# Patient Record
Sex: Female | Born: 1935 | Race: White | Hispanic: No | Smoking: Never smoker
Health system: Southern US, Community
[De-identification: ages and names within clinical notes are randomized; demographics above are authoritative.]

## PROBLEM LIST (undated history)

## (undated) DIAGNOSIS — M1990 Unspecified osteoarthritis, unspecified site: Secondary | ICD-10-CM

## (undated) DIAGNOSIS — I2510 Atherosclerotic heart disease of native coronary artery without angina pectoris: Secondary | ICD-10-CM

## (undated) DIAGNOSIS — E039 Hypothyroidism, unspecified: Secondary | ICD-10-CM

## (undated) DIAGNOSIS — I4891 Unspecified atrial fibrillation: Secondary | ICD-10-CM

## (undated) DIAGNOSIS — R519 Headache, unspecified: Secondary | ICD-10-CM

## (undated) DIAGNOSIS — R51 Headache: Secondary | ICD-10-CM

## (undated) DIAGNOSIS — Z8669 Personal history of other diseases of the nervous system and sense organs: Secondary | ICD-10-CM

## (undated) DIAGNOSIS — I10 Essential (primary) hypertension: Secondary | ICD-10-CM

## (undated) DIAGNOSIS — K219 Gastro-esophageal reflux disease without esophagitis: Secondary | ICD-10-CM

## (undated) DIAGNOSIS — R296 Repeated falls: Secondary | ICD-10-CM

## (undated) DIAGNOSIS — I219 Acute myocardial infarction, unspecified: Secondary | ICD-10-CM

## (undated) DIAGNOSIS — J189 Pneumonia, unspecified organism: Secondary | ICD-10-CM

## (undated) DIAGNOSIS — R569 Unspecified convulsions: Secondary | ICD-10-CM

## (undated) DIAGNOSIS — Z8489 Family history of other specified conditions: Secondary | ICD-10-CM

## (undated) DIAGNOSIS — I639 Cerebral infarction, unspecified: Secondary | ICD-10-CM

## (undated) HISTORY — PX: ABDOMINAL HYSTERECTOMY: SHX81

## (undated) HISTORY — PX: CATARACT EXTRACTION W/ INTRAOCULAR LENS  IMPLANT, BILATERAL: SHX1307

## (undated) HISTORY — PX: CORONARY ANGIOPLASTY: SHX604

## (undated) HISTORY — PX: OOPHORECTOMY: SHX86

---

## 1998-07-17 ENCOUNTER — Other Ambulatory Visit: Admission: RE | Admit: 1998-07-17 | Discharge: 1998-07-17 | Payer: Self-pay | Admitting: *Deleted

## 1999-08-14 ENCOUNTER — Other Ambulatory Visit: Admission: RE | Admit: 1999-08-14 | Discharge: 1999-08-14 | Payer: Self-pay | Admitting: *Deleted

## 1999-10-20 ENCOUNTER — Encounter: Payer: Self-pay | Admitting: *Deleted

## 1999-10-20 ENCOUNTER — Encounter: Admission: RE | Admit: 1999-10-20 | Discharge: 1999-10-20 | Payer: Self-pay | Admitting: *Deleted

## 2000-01-14 ENCOUNTER — Encounter: Payer: Self-pay | Admitting: Neurology

## 2000-01-14 ENCOUNTER — Inpatient Hospital Stay (HOSPITAL_COMMUNITY): Admission: EM | Admit: 2000-01-14 | Discharge: 2000-01-19 | Payer: Self-pay | Admitting: Emergency Medicine

## 2000-01-18 ENCOUNTER — Encounter: Payer: Self-pay | Admitting: Neurology

## 2000-02-11 ENCOUNTER — Encounter (INDEPENDENT_AMBULATORY_CARE_PROVIDER_SITE_OTHER): Payer: Self-pay | Admitting: Specialist

## 2000-02-11 ENCOUNTER — Ambulatory Visit (HOSPITAL_COMMUNITY): Admission: RE | Admit: 2000-02-11 | Discharge: 2000-02-11 | Payer: Self-pay | Admitting: Gastroenterology

## 2000-04-25 ENCOUNTER — Encounter: Admission: RE | Admit: 2000-04-25 | Discharge: 2000-05-17 | Payer: Self-pay | Admitting: *Deleted

## 2000-08-01 ENCOUNTER — Encounter: Admission: RE | Admit: 2000-08-01 | Discharge: 2000-08-01 | Payer: Self-pay | Admitting: Internal Medicine

## 2000-08-01 ENCOUNTER — Encounter: Payer: Self-pay | Admitting: Internal Medicine

## 2002-07-01 ENCOUNTER — Encounter: Payer: Self-pay | Admitting: *Deleted

## 2002-07-01 ENCOUNTER — Inpatient Hospital Stay (HOSPITAL_COMMUNITY): Admission: EM | Admit: 2002-07-01 | Discharge: 2002-07-09 | Payer: Self-pay | Admitting: *Deleted

## 2002-07-02 ENCOUNTER — Encounter (INDEPENDENT_AMBULATORY_CARE_PROVIDER_SITE_OTHER): Payer: Self-pay | Admitting: *Deleted

## 2002-07-02 ENCOUNTER — Encounter: Payer: Self-pay | Admitting: Internal Medicine

## 2002-07-02 HISTORY — PX: CHOLECYSTECTOMY: SHX55

## 2002-07-24 ENCOUNTER — Emergency Department (HOSPITAL_COMMUNITY): Admission: EM | Admit: 2002-07-24 | Discharge: 2002-07-24 | Payer: Self-pay | Admitting: Emergency Medicine

## 2004-10-29 ENCOUNTER — Encounter: Admission: RE | Admit: 2004-10-29 | Discharge: 2004-10-29 | Payer: Self-pay | Admitting: Gastroenterology

## 2009-09-23 ENCOUNTER — Observation Stay (HOSPITAL_COMMUNITY): Admission: EM | Admit: 2009-09-23 | Discharge: 2009-09-25 | Payer: Self-pay | Admitting: Emergency Medicine

## 2010-06-17 ENCOUNTER — Encounter: Admission: RE | Admit: 2010-06-17 | Discharge: 2010-06-17 | Payer: Self-pay | Admitting: Internal Medicine

## 2010-11-06 LAB — CBC
MCHC: 34.2 g/dL (ref 30.0–36.0)
MCV: 95.1 fL (ref 78.0–100.0)
Platelets: 205 10*3/uL (ref 150–400)
Platelets: 245 10*3/uL (ref 150–400)
RDW: 12.8 % (ref 11.5–15.5)
RDW: 13.2 % (ref 11.5–15.5)

## 2010-11-06 LAB — COMPREHENSIVE METABOLIC PANEL
ALT: 20 U/L (ref 0–35)
AST: 33 U/L (ref 0–37)
Albumin: 3 g/dL — ABNORMAL LOW (ref 3.5–5.2)
Albumin: 3.8 g/dL (ref 3.5–5.2)
Alkaline Phosphatase: 103 U/L (ref 39–117)
Calcium: 8.4 mg/dL (ref 8.4–10.5)
Creatinine, Ser: 0.89 mg/dL (ref 0.4–1.2)
GFR calc Af Amer: >60 mL/min (ref >60)
GFR calc non Af Amer: >60 mL/min (ref >60)
Potassium: 4.5 mEq/L (ref 3.5–5.1)
Sodium: 136 mEq/L (ref 135–145)
Sodium: 137 mEq/L (ref 135–145)
Total Protein: 6.4 g/dL (ref 6.0–8.3)
Total Protein: 7.5 g/dL (ref 6.0–8.3)

## 2010-11-06 LAB — DIFFERENTIAL
Basophils Relative: 0 % (ref 0–1)
Eosinophils Absolute: 0.1 10*3/uL (ref 0.0–0.7)
Eosinophils Relative: 0 % (ref 0–5)
Lymphocytes Relative: 3 % — ABNORMAL LOW (ref 12–46)
Lymphs Abs: 0.2 10*3/uL — ABNORMAL LOW (ref 0.7–4.0)
Monocytes Absolute: 0.4 10*3/uL (ref 0.1–1.0)
Monocytes Relative: 3 % (ref 3–12)
Monocytes Relative: 7 % (ref 3–12)

## 2010-11-06 LAB — URINALYSIS, ROUTINE W REFLEX MICROSCOPIC
Glucose, UA: NEGATIVE mg/dL
Hgb urine dipstick: NEGATIVE
Protein, ur: 100 mg/dL — AB
Specific Gravity, Urine: 1.024 (ref 1.005–1.030)

## 2010-11-06 LAB — CULTURE, BLOOD (ROUTINE X 2)
Culture: NO GROWTH
Culture: NO GROWTH

## 2010-11-06 LAB — URINE MICROSCOPIC-ADD ON

## 2010-11-06 LAB — LIPASE, BLOOD: Lipase: 28 U/L (ref 11–59)

## 2010-11-06 LAB — PHOSPHORUS: Phosphorus: 3.4 mg/dL (ref 2.3–4.6)

## 2011-01-01 NOTE — Op Note (Signed)
NAME:  Tracey Wells, Tracey Wells NO.:  [id number]   MEDICAL RECORD NO.:  [id number]                   PATIENT TYPE:  INP   LOCATION:  5155                                 FACILITY:  [facility]   PHYSICIAN:  Gabrielle Dare. Janee Morn, M.D.             DATE OF BIRTH:  [birth year]   DATE OF PROCEDURE:  07/02/2002  DATE OF DISCHARGE:                                 OPERATIVE REPORT   PREOPERATIVE DIAGNOSIS:  Acute cholecystitis.   POSTOPERATIVE DIAGNOSIS:  Acute cholecystitis.   PROCEDURE:  Laparoscopic cholecystectomy.   SURGEON:  Gabrielle Dare. Janee Morn, M.D.   ASSISTANT:  Jimmye Norman, M.D.   ANESTHESIA:  General.   HISTORY OF PRESENT ILLNESS:  The patient is a 77 year old female who  presented with right lower chest pain and right upper quadrant abdominal  pain yesterday.  She was evaluated by an ultrasound that showed  cholelithiasis and she had a HIDA scan which was consistent with acute  cholecystitis.  She is brought to the operating room.   PROCEDURE IN DETAIL:  The patient is brought to the operating room and  general anesthesia was administered.  The patient's abdomen was prepped and  draped in a sterile fashion.  Preoperative antibiotics were given.  An  incision was made in a similar fashion beneath the patient's umbilicus.  The  subcutaneous tissue were dissected and the anterior abdominal fascia was  divided sharply and this was retracted with Kocher clamps and the peritoneum  was entered without difficulty.  Subsequently, a 2-0 Vicryl purse-string  suture was placed around this peritoneal opening and the Hasson trocar was  placed.  The abdomen was insufflated with carbon dioxide in a standard  fashion.  Subsequently, under direct vision, three ports were placed, the 10  mm epigastric port with the two 5 mm lateral ports.  Subsequently, on  exploration, there was some fluid around the gallbladder and in the right  upper quadrant as well as some adhesions to the  gallbladder.  The  gallbladder was decompressed with about 60 cc of clear straw colored fluid.  The dome of the gallbladder was retracted superomedially and the  infundibulum retracted inferolaterally.  Dissection was begun laterally  around the infundibulum and then proceeded medially where the cystic duct  was clearly identified.  The dissection was continued until a large window  was made between the cystic duct infundibulum and the liver.  Further  dissection made the dissection certain and three clips were placed  proximally on the cystic duct and one clip placed distally and it was  divided sharply.  Subsequent dissection revealed two smaller tubular  structures that were likely lymphatics or small arterial branches, these  were clipped proximally twice and divided.  Further dissection revealed the  cystic artery.  This was dissected out carefully and then two clips were  placed proximally and one distally and it was divided.  The gallbladder was  then taken off the liver bed with the Bovie cautery using the hook and the  spatula.  As this proceeded, some areas on the liver bed were bovied to  obtain good hemostasis.  The gallbladder was removed and placed in the  endocatch bag and removed through the umbilical port.  Subsequently, the  liver bed was irrigated and several areas on it were cauterized with the  Bovie to obtain good hemostasis.  The abdomen was irrigated out until the  fluid was clear.  The liver bed was rechecked and one small area was bovied  again and half a piece of Surgicel was placed in the liver bed after it was  reassured that it was dry.  Subsequently, the rest of the irrigation fluid  was evacuated from the abdomen and it was clear.  The ports were removed  under direct vision.  The umbilical incision was closed by tying the purse-  string  suture and all the wounds were irrigated and closed with running  subcuticular 4-0 Vicryl suture.  Benzoin, Steri-Strips,  and sterile  dressings were applied.  The sponge and needle counts were correct.  The  patient was taken to the recovery room in stable condition.                                               Gabrielle Dare Janee Morn, M.D.    BET/MEDQ  D:  07/02/2002  T:  07/02/2002  Job:  [id number]

## 2011-01-01 NOTE — Discharge Summary (Signed)
NAME:  Tracey Wells, Tracey Wells NO.:  [id number]   MEDICAL RECORD NO.:  [id number]                   PATIENT TYPE:  INP   LOCATION:  5155                                 FACILITY:  [facility]   PHYSICIAN:  Thora Lance, M.D.               DATE OF BIRTH:  26-May-1921   DATE OF ADMISSION:  07/01/2002  DATE OF DISCHARGE:                                 DISCHARGE SUMMARY   REASON FOR ADMISSION:  This is a 77 year old white female who was admitted  on 07/02/02 with a one and a half week history of nausea, vomiting, acid  reflux symptoms, diarrhea, and intermittent cough.   SIGNIFICANT FINDINGS:  VITAL SIGNS:  Temperature 99.7, blood pressure  155/82, heart rate 76, respirations 22.  ABDOMEN:  She had a positive Murphy's sign, obvious right upper quadrant  tenderness, no rebound or peritoneal signs.   The rest of the exam was unremarkable.   LABORATORIES ON ADMISSION:  CBC:  WBC 10.2, hemoglobin 13.5, platelet count  295,000; 83 neutrophils, 10 lymphs, 5 monos, 1 eosinophil, 0 basophils.  Chemistries:  Sodium 135, potassium 3.5, chloride 103, bicarbonate 26,  glucose 131, BUN 12, creatinine 0.7, calcium 8.8, total protein 6.8, albumin  3.5, AST 22, ALT 17, alk phos 128, total bilirubin 0.3, lipase 35.  CK 71,  CK-MB 1.4, troponin 0.01.   HOSPITAL COURSE:  Problem 1.  Acute cholecystitis.  The patient was admitted  with right upper quadrant pain and low-grade fever with a white count  showing a left shift.  Right upper quadrant ultrasound did show  cholelithiasis.  Chest x-ray showed a questionable right lower lobe  infiltrate.  The patient had a HIDA scan on the second hospital day which  was consistent with possible acute cholecystitis.  Dr. Janee Morn of general  surgery was consulted.  The patient was taken for laparoscopic  cholecystectomy on 07/02/02 and tolerated this well.  Postoperatively, the  patient's p.o. intake improved very slowly and she remained  nauseated.  On  07/08/02, Phenergan was added on a pre meal basis.  Her nausea improved and  her p.o. intake improved remarkably and she was able to be discharged.   The patient was seen by physical therapy who felt she was independent with  mobility.  A rolling walker and 3-in-1 were ordered from [facility]  by the case manager to be delivered to the patient's home.   Problem 2.  Right lower lobe pneumonia.  The patient had a right lower lobe  infiltrate on chest x-ray PA and lateral on hospitalization.  She was  started on Rocephin and remained febrile.  Azithromycin was added.  The  patient's fever resolved.  Her cough resolved.  Her Rocephin was  discontinued and she was continued on a p.o. course of azithromycin.   Problem 3.  Gastroesophageal reflux disease.  The patient was started on  Protonix.  She had  no further problems with heartburn or acid reflux during  hospitalization.   Problem 4.  Coronary artery disease/hypertension.  These problems remained  stable for the hospitalization.   DISCHARGE DIAGNOSES:  1. Acute cholecystitis.  2. Right lower lobe pneumonia.  3. Gastroesophageal reflux disease.  4. Coronary artery disease.  5. Hypertension.   PROCEDURES:  1. Right upper quadrant ultrasound.  2. HIDA scan.  3. Laparoscopic cholecystectomy.   DISCHARGE MEDICATIONS:  1. Azithromycin 500 mg p.o. q.d. x4 days.  2. Phenergan 12.5 mg p.o. 30 minutes prior to meals p.r.n.  3. Protonix 40 mg q.d.  4. Percocet 1 q.6h. p.r.n. pain.  5. Altace 2.5 mg q.d.  6. Metoprolol 50 mg b.i.d.  7. Synthroid 250 mcg q.d.  8. Plavix 75 mg q.d.  9. Dilantin 400 mg q.h.s.   ACTIVITY:  As tolerated.   DIET:  Regular.   FOLLOW UP:  In two weeks with Dr. Valentina Lucks.  In two weeks with Dr. Violeta Gelinas of the general surgical service.                                                  Thora Lance, M.D.    Delorse Limber  D:  07/09/2002  T:  07/09/2002  Job:  [id number]    cc:   Gabrielle Dare. Janee Morn, M.D.  [facility]  [address] [place], [place] [address]  Fax: [phone]

## 2011-01-01 NOTE — H&P (Signed)
NAME:  Tracey Wells, Tracey Wells NO.:  [id number]   MEDICAL RECORD NO.:  [id number]                   PATIENT TYPE:   LOCATION:                                       FACILITY:   PHYSICIAN:  Marcene Duos, M.D.         DATE OF BIRTH:  1921/02/12   DATE OF ADMISSION:  07/01/2002  DATE OF DISCHARGE:                                HISTORY & PHYSICAL   PRIMARY CARE PHYSICIAN:  Thora Lance, M.D.   CHIEF COMPLAINT:  Nausea, vomiting, right upper quadrant pain and cough.   HISTORY OF PRESENT ILLNESS:  This is a 77 year old female with a history of  coronary artery disease and stroke who presents with 1-1/2 week history of  nausea, vomiting, acid reflux symptoms, diarrhea, and intermittent cough.  The patient states that she awakened in the early morning hours 1-1/2 weeks  ago with a burning, ascitic feeling in her throat.  Over the ensuing hours,  she developed a sense of feverishness, clamminess, nausea, vomiting,  diarrhea, and a cough occasionally productive of yellow sputum.  These  symptoms continued on significantly for the next two days.  She states that  she took Zantac for the acid reflux symptoms which helped those particular  symptoms but not the nausea, vomiting or diarrhea.  She was initially even  unable to keep clear liquids down.  She states that she is gradually feeling  minimally improved, states that she was nauseated all day today but did not  vomit, with her last emesis being over 24 hours ago.  She has continued to  have frequent lose stools though they are low in volume.  She has not noted  any change in color, any melena or hematochezia with this.  She states that  after breakfast this morning, which was either raisin bran or toast, and  coffee, she developed a dull pain, radiating in her right upper quadrant to  her right shoulder blade.  She thought she was having a heart attack and  called and then came into the emergency  department.  In the emergency  department, her white count was 10.2 with 30% neutrophils, 10% lymphocytes,  hemoglobin at 13.5, platelets 295.  CPK was 71, troponin I was less than  0.01.  ECG showed poor R wave progression but no acute changes.  Alkaline  phosphatase is mildly elevated at 128. The rest of her liver profile was  negative. Lipase was normal and electrolytes, except for a mildly elevated  glucose at 125, were normal. Chest x-ray, which was AP view showed a  questionable right lower lobe infiltrate, though compared to old studies, I  am not certain this is anything new, and abdominal ultrasound ordered in the  emergency department showed multiple gallstones; bile duct was not dilated.  There was no thickening of the gallbladder wall.   The patient was evaluated by Dr. Danna Hefty.  Unfortunately, the  patient had received  a couple of doses of morphine prior to that and was  notably improved by the time she was evaluated by Dr. Luan Pulling, and had  little to no abdominal discomfort at that time.  Because on admission she  was not having a fever, her white count was not elevated, and her history  was felt to be more of a viral type picture.   PAST MEDICAL HISTORY:  1. MI in the late 1980s followed by angioplasty x2.  She has essentially     been stable since.  2. Hypothyroidism.  3. Hyperlipidemia.  4. History of stroke affecting balance and lower extremity strength she     states bilaterally.  5. History of seizure disorder.  No seizure activity for years, however.  6. History of left Bell's palsy.  7. Hypertension that has been well-controlled.   PAST SURGICAL HISTORY:  Unilateral oophorectomy for benign reasons many  years ago.    MEDICATIONS:  1. Altace 2.5 mg daily.  2. Metoprolol 50 mg b.i.d.  3. Synthroid 250 mcg daily.  4. Plavix 75 mg daily, started after her stroke.  5. Dilantin 400 mg p.o. q.h.s.  6. Zantac 150 mg p.o. q.h.s.  7. Reglan 5 mg q.  a.c. and h.s. which she has used in the past for more     severe reflux symptoms.  She has not used this in some time.  8. Zocor 40 mg daily, recently discontinued for muscle weakness.   ALLERGIES:  No known drug allergies.   HABITS:  Tobacco:  No use.  Alcohol:  No use.   REVIEW OF SYSTEMS:  Colonoscopy 1-1/2 years ago, she believes was within  normal limits, though she states she needs another one in about 3-1/2 years.  She is unaware of any polyps found in the past.  She is uncertain that she  has ever had an EGD, though it sounds like she has had barium swallows in  the past, or upper GIs in the past.  She is uncertain what that showed.   FAMILY HISTORY:  Father died in his 30s, mother in her 64s, brother in his  67s, sister in her 78s, all of myocardial infarction.  Son with coronary  artery disease, stenting x2, a daughter who is fairly healthy.   SOCIAL HISTORY:  The patient is retired from working in a [employer] claims handler.  She has been widowed since 1985.   PHYSICAL EXAMINATION:  VITAL SIGNS:  On examination, temperature is 99.7,  blood pressure 155/82, heart rate 76, respiratory rate 22.  HEENT:  Mucous membranes are dry. Throat is without injection.  NECK:  Supple without adenopathy.  CHEST:  Clear.  CARDIOVASCULAR:  Regular rate and rhythm with normal S1 and S2.  No S3, S4  murmur or rub appreciated.  No carotid bruits.  PULSES:  Carotid, radial, femoral, DP pulses normodynamic and equal.  EXTREMITY:  No lower extremity edema.  ABDOMEN:  Soft, bowel sounds present throughout.  She has what appears to be  a positive Murphy's sign, obvious right upper quadrant tenderness currently.  No organomegaly or masses appreciated.  No rebound or peritoneal sign.  NEUROLOGIC:  Alert and oriented x3.  Cranial nerves II-XII grossly intact.  Motor is grossly within normal limits.   ASSESSMENT AND PLAN:  1. Right upper quadrant pain with low grade temperature, borderline white    blood  cell count with mild left shift and ultrasound positive for     cholelithiasis and questionable right lower lobe infiltrate  on chest x-     ray. At this point, patient appears most likely to have a gallbladder     source seems most likely for the cause of her symptoms. However, because     of her waxing and waning clinical picture this is not totally clear at     this point.  I am not going to begin her on any antibiotics for now.  Her     respiratory symptoms are quite minor at this point, in fact she did not     seem to be short of breath or cough the entire time I was in the room     with her this evening.  Will admit, hydrate.  If this is more of a     respiratory problem, I would expect the pneumonia to bloom with fluid     resuscitation.  I will go ahead and allow her to have some pain     medication overnight for rest but then we will discontinue that early     morning so that she can be reevaluated.  CBC, BMET and liver profile     repeat in the morning.  I did ask them to add a GTT to her blood drawn     earlier, though I feel this is less likely.  Will check stools for white     blood cells and culture. Repeat chest x-ray and hopefully we will be able     to get a PA and lateral tomorrow. Will also treat nausea with Tigan     suppositories.  2. Hypertension:  Will start her Toprol-XL only for now, hold her ACE     inhibitor.  3. History of stroke:  I am going to hold her Plavix for now with the     possibility of cervical intervention in ensuing days.  4. Gastroesophageal reflux disease with worsened symptoms here recently.     Protonix IV.  5. Hypothyroidism:  Will hold Synthroid for now.  6. Seizure disorder:  Will continue her Dilantin with sips of fluid.                                               Marcene Duos, M.D.    EMM/MEDQ  D:  07/02/2002  T:  07/02/2002  Job:  [id number]   cc:   Thora Lance, M.D.  [address]  [place]  [place] [address]   Fax: [phone]

## 2011-01-01 NOTE — Discharge Summary (Signed)
[place]. [facility]  Patient:    Tracey Wells, Tracey Wells                     MRN: [id number] Adm. Date:  [id number] Disc. Date: [id number] Attending:  Glean Hess D CC:         Lum Babe, M.D.                           Discharge Summary  DISCHARGE DIAGNOSES: 1. Cerebellar infarction. 2. Urinary tract infection.  PROCEDURES AND INTERVENTIONS: 1. CT scan of the head. 2. A 2-D echocardiogram. 3. Carotid Doppler ultrasound. 4. Transcranial Doppler.  SUMMARY OF HOSPITALIZATION:  The patient is a 77 year old woman who was referred by Dr. Lum Babe from his office to [facility] for a one-week history of inability to ambulate, staggering, dizziness, and incoordination of the left upper extremity.  The patient has no previous history of strokes or TIAs.  PAST MEDICAL HISTORY:  Essentially hypothyroidism, a complex partial seizure and coronary artery disease.  PHYSICAL EXAMINATION:  NEUROLOGIC:  Awake, alert, oriented.  Speech fluent.  Memory and language appropriate for age.  Pupils are equal, round and reactive bilaterally. Extraocular cephalic movements intact.  No nystagmus.  Face symmetric.  Motor examination:  Strength equal bilaterally with incoordination of the left upper extremity on finger-to-nose.  Gait ataxic.  HOSPITAL COURSE:  The patient was admitted to the hospital for further workup and testing for cerebrovascular disease.  Initial management consisted of hemodynamic support with IV fluids, normal saline, oxygen 2 L, and heparin ischemic stroke protocol.  Since the patient was extremely claustrophobic, an MRI/MRA of the brain could not be obtained.  A CT scan of the head showed no evidence of acute ischemia.  A 2-D echocardiogram showed areas of hypokinesis on the apex inferior and septal wall with ejection fraction mildly reduced at 45%.  Carotid ultrasound showed no significant ICA stenosis and a transcranial Doppler  unable to obtain temporal windows but vertebrobasilar system with antegrade flow and normal basilar and vertebral arteries by transcranial Dopplers.  A clinical diagnosis of cerebellar infarction was made.  The patient was evaluated with PT, OT, and speech and rehabilitation, as well, and home health therapies were recommended.  Her long-term secondary stroke prevention was switched from aspirin to Plavix 75 mg once a day.  The patient was discharged home in stable condition.  FOLLOW-UP:  She was instructed to call Dr. Lum Babe for a follow-up in two to three weeks. DD:  01/19/00 TD:  01/21/00 Job: [id number]

## 2011-01-01 NOTE — H&P (Signed)
[place]. [facility]  Patient:    HONOUR, SCHWIEGER                     MRN: [id number] Adm. Date:  [id number] Attending:  Hilario Quarry                         History and Physical  CHIEF COMPLAINT:  Difficulty walking.  HISTORY OF PRESENT ILLNESS:  The patient is a 77 year old woman who was referred by Dr. Lum Babe, sent from his office to the [facility] Emergency Room, with a one-week history of inability to ambulate, staggering, dizziness; and last night and this morning complaining also of left hand weakness and incoordination.  She denies double vision.  No nausea, no vomiting, no numbness, no changes in her speech.  No prior history of strokes or TIAs.  PAST MEDICAL HISTORY: 1. Hypothyroidism. 2. Complex partial seizures.  Her last seizure was more than 10 years ago. 3. Coronary artery disease, status post angioplasty 10 years ago. 4. Gastroesophageal reflux disease.  CURRENT MEDICATIONS: 1. Dilantin 400 mg q.h.s. 2. Prevacid 30 mg one q.d. 3. Synthroid 0.25 mg one q.d. 4. Aspirin 325 mg one q.d. 5. Lopressor 25 mg b.i.d.  ALLERGIES:  No known drug allergies.  She does have an intolerance to NITROGLYCERIN.  SOCIAL HISTORY:  Lives with her daughter.  She is fairly independent.  She still drives.  Denies smoking or drinking alcohol.  FAMILY HISTORY:  Mother died of coronary artery disease.  Her brother had a stroke.  Her family physician is Dr. Demetrius Revel.  REVIEW OF SYSTEMS:  As per the history of present illness.  PHYSICAL EXAMINATION:  VITAL SIGNS:  Blood pressure 168/86, pulse 68, temperature 98.6 degrees, oxygen  saturation 95%.  GENERAL:  The patient is laying on a stretcher, in no distress.  HEENT:  Head normocephalic, atraumatic.  NECK:  Supple, no bruits.  LUNGS:  Clear bilaterally.  HEART:  Sounds regular rhythm, without murmurs.  ABDOMEN:  Soft, bowel sounds present.  No  visceromegaly.  EXTREMITIES:  No cyanosis or edema.  NEUROLOGIC:  She is awake and alert.  She is oriented.  Speech is fluent. Memory and language appropriate for age.  Pupils equal, reactive bilaterally. Extraocular cephalic movements intact.  Face is symmetric.  Her tongue is in the midline.  Palate elevates symmetrically.  Motor examination:  Strength is equal  bilaterally.  There is incoordination of finger-to-nose in the left upper extremity.  Reflexes +2 throughout.  Plantars downgoing.  Gait evaluation was deferred.  IMPRESSION: 1. Cerebellar stroke. 2. Coronary artery disease. 3. History of complex partial seizures. 4. History of hypothyroidism.  The plan, recommendations, diagnosis, condition, and further intervention were discussed at length with the patient and with her daughter at the bedside.  We ill admit the patient to the neuro science unit for further workup and testing for cerebrovascular disease.  Initial management will consist of hemodynamic support, IV fluids, normal saline, oxygen 2 L, and heparin ischemic stroke protocol. Will obtain further noninvasive studies to evaluate intra and extracranial vessels, specifically vertebral basilar system with MRI/MRA, and also a 2-D echocardiogram for cardiac evaluation of potential embolic sources as the cause of her stroke.  The patient will be on heparin after her workup is completed.  We also will consult PT and OT in the morning. DD:  01/14/00 TD:  01/14/00 Job: [id number]

## 2011-01-01 NOTE — Consult Note (Signed)
NAME:  Tracey Wells, SEELINGER NO.:  [id number]   MEDICAL RECORD NO.:  [id number]                   PATIENT TYPE:  INP   LOCATION:  5155                                 FACILITY:  [facility]   PHYSICIAN:  Gabrielle Dare. Janee Morn, M.D.             DATE OF BIRTH:  [birth year]   DATE OF CONSULTATION:  07/02/2002  DATE OF DISCHARGE:                                   CONSULTATION   REASON FOR CONSULTATION:  Cholecystitis.   HISTORY OF PRESENT ILLNESS:  The patient is a 77 year old female who  presented to the emergency room yesterday complaining of a week of nausea,  and then she developed some worsening acid reflux and some right lower chest  pain yesterday morning.  She had been having some diarrhea, as well as a  productive cough with yellow sputum.  She began to be unable to keep down  liquids, and had persistent nausea.  The right lower chest, right upper  quadrant abdomen pain she had was dull in nature, it radiated to her back  beneath her right shoulder blade.  She was concerned and came to the  emergency department.   PAST MEDICAL HISTORY:  1. Myocardial infarction.  2. Hypothyroidism.  3. Hyperlipidemia.  4. Stroke.  5. Seizure disorder.  6. Left Bell's palsy.  7. Hypertension.   PAST SURGICAL HISTORY:  Unilateral oophorectomy for benign cyst.   MEDICATIONS:  1. Altace 2.5 mg p.o. q.d.  2. Metoprolol 50 mg b.i.d.  3. Synthroid 250 mcg p.o. q.d.  4. Plavix 75 mg q.d.  5. Dilantin 400 mg q.h.s.  6. Zantac 150 mg q.h.s.  7. Reglan 5 mg q.a.c. and h.s.  8. Zocor 40 mg, but this was recently discontinued.   ALLERGIES:  No known drug allergies.   REVIEW OF SYMPTOMS:  GENERAL:  In general, she has been feeling weak and  down for the past 1-1/2 weeks.  CARDIOVASCULAR:  No central chest pain, but  she is having the right lower chest pain as previously described.  PULMONARY:  She complains of a productive cough.  GASTROINTESTINAL:  Please  see the history of  present illness.  Review of systems is otherwise  negative.   SOCIAL HISTORY:  No tobacco or alcohol.   FAMILY HISTORY:  Both her mother and father died of myocardial infarctions.  She has a brother and sister who both died of myocardial infarctions.  Her  son has coronary artery disease.   PHYSICAL EXAMINATION:  GENERAL:  She is awake and alert.  VITAL SIGNS:  Temperature 99.4, blood pressure 148/75, pulse rate 80,  respirations 20, O2 saturation 96%.  NECK:  Supple.  CHEST:  Had some decreased breath sounds in the right base.  HEART:  Regular rate and rhythm.  ABDOMEN:  Soft, but had some tenderness in the right upper quadrant.  EXTREMITIES:  Warm.   LABORATORY DATA:  Sodium of 135, potassium of  3.7, chloride of 103, CO2 of  27, BUN of 8, creatinine of 0.8, and a glucose of 130.  Hemoglobin of 12.9,  white blood cell count of 10.5, platelets of 278.  Her liver function tests  were within normal limits.  She had an ultrasound which demonstrated  cholelithiasis, but no gallbladder wall thickening.  She had a HIDA scan  that was positive for cholecystitis.   ASSESSMENT:  A 77 year old female with acute cholecystitis.   PLAN:  Take her to the operating room urgently for laparoscopic  cholecystectomy.  Her operative candidacy was discussed with Dr. Kirby Funk, and I discussed the plan and the risks and benefits of surgery with  her daughter and the patient, and they agreed to proceed.                                                Gabrielle Dare Janee Morn, M.D.    BET/MEDQ  D:  07/02/2002  T:  07/02/2002  Job:  [id number]

## 2012-04-30 ENCOUNTER — Ambulatory Visit (HOSPITAL_COMMUNITY)
Admission: RE | Admit: 2012-04-30 | Discharge: 2012-04-30 | Disposition: A | Discharge status: Home / Self Care | Payer: Medicare Other | Source: Ambulatory Visit | Attending: Family Medicine | Admitting: Family Medicine

## 2012-04-30 DIAGNOSIS — M7989 Other specified soft tissue disorders: Secondary | ICD-10-CM | POA: Insufficient documentation

## 2012-04-30 DIAGNOSIS — M79609 Pain in unspecified limb: Secondary | ICD-10-CM

## 2012-04-30 DIAGNOSIS — M79606 Pain in leg, unspecified: Secondary | ICD-10-CM

## 2012-08-16 DIAGNOSIS — J189 Pneumonia, unspecified organism: Secondary | ICD-10-CM

## 2012-08-16 HISTORY — DX: Pneumonia, unspecified organism: J18.9

## 2013-03-29 ENCOUNTER — Other Ambulatory Visit: Payer: Self-pay | Admitting: Nurse Practitioner

## 2013-03-29 ENCOUNTER — Ambulatory Visit
Admission: RE | Admit: 2013-03-29 | Discharge: 2013-03-29 | Disposition: A | Discharge status: Home / Self Care | Payer: Medicare Other | Source: Ambulatory Visit | Attending: Nurse Practitioner | Admitting: Nurse Practitioner

## 2013-03-29 DIAGNOSIS — T148XXA Other injury of unspecified body region, initial encounter: Secondary | ICD-10-CM

## 2013-04-27 ENCOUNTER — Ambulatory Visit
Admission: RE | Admit: 2013-04-27 | Discharge: 2013-04-27 | Disposition: A | Discharge status: Home / Self Care | Payer: Medicare Other | Source: Ambulatory Visit | Attending: Internal Medicine | Admitting: Internal Medicine

## 2013-04-27 ENCOUNTER — Other Ambulatory Visit: Payer: Self-pay | Admitting: Internal Medicine

## 2013-04-27 DIAGNOSIS — M25559 Pain in unspecified hip: Secondary | ICD-10-CM

## 2013-04-27 DIAGNOSIS — W19XXXA Unspecified fall, initial encounter: Secondary | ICD-10-CM

## 2013-08-07 ENCOUNTER — Encounter (HOSPITAL_COMMUNITY): Payer: Self-pay | Admitting: Emergency Medicine

## 2013-08-07 ENCOUNTER — Inpatient Hospital Stay (HOSPITAL_COMMUNITY)
Admission: EM | Admit: 2013-08-07 | Discharge: 2013-08-12 | DRG: 194 | Disposition: A | Discharge status: Home Health | Payer: Medicare Other | Attending: Internal Medicine | Admitting: Internal Medicine

## 2013-08-07 ENCOUNTER — Emergency Department (HOSPITAL_COMMUNITY): Payer: Medicare Other

## 2013-08-07 DIAGNOSIS — I10 Essential (primary) hypertension: Secondary | ICD-10-CM | POA: Diagnosis present

## 2013-08-07 DIAGNOSIS — G40909 Epilepsy, unspecified, not intractable, without status epilepticus: Secondary | ICD-10-CM | POA: Diagnosis present

## 2013-08-07 DIAGNOSIS — Z823 Family history of stroke: Secondary | ICD-10-CM

## 2013-08-07 DIAGNOSIS — K219 Gastro-esophageal reflux disease without esophagitis: Secondary | ICD-10-CM | POA: Diagnosis present

## 2013-08-07 DIAGNOSIS — R4182 Altered mental status, unspecified: Secondary | ICD-10-CM | POA: Diagnosis present

## 2013-08-07 DIAGNOSIS — K5909 Other constipation: Secondary | ICD-10-CM | POA: Diagnosis present

## 2013-08-07 DIAGNOSIS — R509 Fever, unspecified: Secondary | ICD-10-CM | POA: Insufficient documentation

## 2013-08-07 DIAGNOSIS — Z8249 Family history of ischemic heart disease and other diseases of the circulatory system: Secondary | ICD-10-CM

## 2013-08-07 DIAGNOSIS — Z8673 Personal history of transient ischemic attack (TIA), and cerebral infarction without residual deficits: Secondary | ICD-10-CM

## 2013-08-07 DIAGNOSIS — E039 Hypothyroidism, unspecified: Secondary | ICD-10-CM | POA: Diagnosis present

## 2013-08-07 DIAGNOSIS — J189 Pneumonia, unspecified organism: Principal | ICD-10-CM

## 2013-08-07 DIAGNOSIS — H409 Unspecified glaucoma: Secondary | ICD-10-CM | POA: Diagnosis present

## 2013-08-07 DIAGNOSIS — E869 Volume depletion, unspecified: Secondary | ICD-10-CM | POA: Diagnosis present

## 2013-08-07 DIAGNOSIS — I2510 Atherosclerotic heart disease of native coronary artery without angina pectoris: Secondary | ICD-10-CM | POA: Diagnosis present

## 2013-08-07 DIAGNOSIS — K5900 Constipation, unspecified: Secondary | ICD-10-CM | POA: Diagnosis present

## 2013-08-07 DIAGNOSIS — Z9181 History of falling: Secondary | ICD-10-CM

## 2013-08-07 DIAGNOSIS — I252 Old myocardial infarction: Secondary | ICD-10-CM

## 2013-08-07 DIAGNOSIS — R5381 Other malaise: Secondary | ICD-10-CM | POA: Diagnosis present

## 2013-08-07 DIAGNOSIS — N390 Urinary tract infection, site not specified: Secondary | ICD-10-CM | POA: Diagnosis present

## 2013-08-07 DIAGNOSIS — R32 Unspecified urinary incontinence: Secondary | ICD-10-CM | POA: Diagnosis present

## 2013-08-07 HISTORY — DX: Atherosclerotic heart disease of native coronary artery without angina pectoris: I25.10

## 2013-08-07 HISTORY — DX: Unspecified convulsions: R56.9

## 2013-08-07 HISTORY — DX: Cerebral infarction, unspecified: I63.9

## 2013-08-07 HISTORY — DX: Personal history of other diseases of the nervous system and sense organs: Z86.69

## 2013-08-07 HISTORY — DX: Gastro-esophageal reflux disease without esophagitis: K21.9

## 2013-08-07 HISTORY — DX: Hypothyroidism, unspecified: E03.9

## 2013-08-07 HISTORY — DX: Essential (primary) hypertension: I10

## 2013-08-07 HISTORY — DX: Acute myocardial infarction, unspecified: I21.9

## 2013-08-07 LAB — URINALYSIS, ROUTINE W REFLEX MICROSCOPIC
Bilirubin Urine: NEGATIVE
Glucose, UA: NEGATIVE mg/dL
Hgb urine dipstick: NEGATIVE
Ketones, ur: NEGATIVE mg/dL
Specific Gravity, Urine: 1.013 (ref 1.005–1.030)
Urobilinogen, UA: 1 mg/dL (ref 0.0–1.0)
pH: 7 (ref 5.0–8.0)

## 2013-08-07 LAB — BASIC METABOLIC PANEL
BUN: 20 mg/dL (ref 6–23)
CO2: 21 mEq/L (ref 19–32)
Chloride: 102 mEq/L (ref 96–112)
GFR calc Af Amer: 86 mL/min — ABNORMAL LOW (ref >90)
GFR calc non Af Amer: 74 mL/min — ABNORMAL LOW (ref >90)
Glucose, Bld: 124 mg/dL — ABNORMAL HIGH (ref 70–99)
Potassium: 3.5 mEq/L (ref 3.5–5.1)

## 2013-08-07 LAB — CBC
HCT: 33.2 % — ABNORMAL LOW (ref 36.0–46.0)
MCHC: 32.8 g/dL (ref 30.0–36.0)
Platelets: 219 10*3/uL (ref 150–400)
RDW: 12.9 % (ref 11.5–15.5)
WBC: 17.7 10*3/uL — ABNORMAL HIGH (ref 4.0–10.5)

## 2013-08-07 LAB — CBC WITH DIFFERENTIAL/PLATELET
Eosinophils Absolute: 0 10*3/uL (ref 0.0–0.7)
Hemoglobin: 11.2 g/dL — ABNORMAL LOW (ref 12.0–15.0)
Lymphocytes Relative: 6 % — ABNORMAL LOW (ref 12–46)
Lymphs Abs: 0.8 10*3/uL (ref 0.7–4.0)
MCH: 30.9 pg (ref 26.0–34.0)
Monocytes Absolute: 1.1 10*3/uL — ABNORMAL HIGH (ref 0.1–1.0)
Monocytes Relative: 8 % (ref 3–12)
Neutro Abs: 12.4 10*3/uL — ABNORMAL HIGH (ref 1.7–7.7)
Neutrophils Relative %: 86 % — ABNORMAL HIGH (ref 43–77)
RBC: 3.62 MIL/uL — ABNORMAL LOW (ref 3.87–5.11)
WBC: 14.3 10*3/uL — ABNORMAL HIGH (ref 4.0–10.5)

## 2013-08-07 LAB — CREATININE, SERUM
Creatinine, Ser: 0.74 mg/dL (ref 0.50–1.10)
GFR calc Af Amer: 84 mL/min — ABNORMAL LOW (ref >90)
GFR calc non Af Amer: 73 mL/min — ABNORMAL LOW (ref >90)

## 2013-08-07 LAB — STREP PNEUMONIAE URINARY ANTIGEN: Strep Pneumo Urinary Antigen: NEGATIVE

## 2013-08-07 LAB — URINE MICROSCOPIC-ADD ON

## 2013-08-07 MED ORDER — ENSURE COMPLETE PO LIQD
237.0000 mL | ORAL | Status: DC
  Administered 2013-08-07 – 2013-08-10 (×4): 237 mL via ORAL

## 2013-08-07 MED ORDER — ACETAMINOPHEN 325 MG PO TABS: 650.0000 mg | ORAL_TABLET | Freq: Four times a day (QID) | ORAL | Status: DC | PRN

## 2013-08-07 MED ORDER — ONDANSETRON HCL 4 MG/2ML IJ SOLN: 4.0000 mg | Freq: Three times a day (TID) | INTRAMUSCULAR | Status: DC | PRN

## 2013-08-07 MED ORDER — PHENYTOIN SODIUM EXTENDED 100 MG PO CAPS
100.0000 mg | ORAL_CAPSULE | Freq: Three times a day (TID) | ORAL | Status: DC
  Administered 2013-08-07 – 2013-08-12 (×15): 100 mg via ORAL
  Filled 2013-08-07 (×18): qty 1

## 2013-08-07 MED ORDER — DOCUSATE SODIUM 100 MG PO CAPS
100.0000 mg | ORAL_CAPSULE | Freq: Two times a day (BID) | ORAL | Status: DC
  Administered 2013-08-07 – 2013-08-12 (×11): 100 mg via ORAL
  Filled 2013-08-07 (×12): qty 1

## 2013-08-07 MED ORDER — ACETAMINOPHEN 650 MG RE SUPP: 650.0000 mg | Freq: Four times a day (QID) | RECTAL | Status: DC | PRN

## 2013-08-07 MED ORDER — ALBUTEROL SULFATE (5 MG/ML) 0.5% IN NEBU: 2.5000 mg | INHALATION_SOLUTION | RESPIRATORY_TRACT | Status: DC | PRN

## 2013-08-07 MED ORDER — MAGNESIUM CITRATE PO SOLN: 1.0000 | Freq: Once | ORAL | Status: DC

## 2013-08-07 MED ORDER — LATANOPROST 0.005 % OP SOLN
1.0000 [drp] | Freq: Every day | OPHTHALMIC | Status: DC
  Administered 2013-08-07 – 2013-08-11 (×4): 1 [drp] via OPHTHALMIC
  Filled 2013-08-07: qty 2.5

## 2013-08-07 MED ORDER — ENSURE PUDDING PO PUDG
1.0000 | ORAL | Status: DC
  Administered 2013-08-07 – 2013-08-10 (×3): 1 via ORAL
  Filled 2013-08-07 (×6): qty 1

## 2013-08-07 MED ORDER — SODIUM CHLORIDE 0.9 % IV SOLN
INTRAVENOUS | Status: AC
  Administered 2013-08-07: 12:00:00 via INTRAVENOUS

## 2013-08-07 MED ORDER — ADULT MULTIVITAMIN W/MINERALS CH
1.0000 | ORAL_TABLET | Freq: Every day | ORAL | Status: DC
  Administered 2013-08-07 – 2013-08-12 (×6): 1 via ORAL
  Filled 2013-08-07 (×6): qty 1

## 2013-08-07 MED ORDER — CEFTRIAXONE SODIUM 1 G IJ SOLR
1.0000 g | Freq: Once | INTRAMUSCULAR | Status: AC
  Administered 2013-08-07: 1 g via INTRAVENOUS
  Filled 2013-08-07: qty 10

## 2013-08-07 MED ORDER — ENOXAPARIN SODIUM 40 MG/0.4ML ~~LOC~~ SOLN
40.0000 mg | SUBCUTANEOUS | Status: DC
  Administered 2013-08-07 – 2013-08-11 (×5): 40 mg via SUBCUTANEOUS
  Filled 2013-08-07 (×6): qty 0.4

## 2013-08-07 MED ORDER — ONDANSETRON HCL 4 MG/2ML IJ SOLN: 4.0000 mg | Freq: Four times a day (QID) | INTRAMUSCULAR | Status: DC | PRN

## 2013-08-07 MED ORDER — LEVOTHYROXINE SODIUM 150 MCG PO TABS
150.0000 ug | ORAL_TABLET | Freq: Every day | ORAL | Status: DC
  Administered 2013-08-08 – 2013-08-12 (×5): 150 ug via ORAL
  Filled 2013-08-07 (×7): qty 1

## 2013-08-07 MED ORDER — FLEET ENEMA 7-19 GM/118ML RE ENEM
1.0000 | ENEMA | Freq: Once | RECTAL | Status: AC
  Administered 2013-08-07: 1 via RECTAL
  Filled 2013-08-07: qty 1

## 2013-08-07 MED ORDER — ONDANSETRON HCL 4 MG PO TABS: 4.0000 mg | ORAL_TABLET | Freq: Four times a day (QID) | ORAL | Status: DC | PRN

## 2013-08-07 MED ORDER — SODIUM CHLORIDE 0.9 % IJ SOLN
3.0000 mL | Freq: Two times a day (BID) | INTRAMUSCULAR | Status: DC
  Administered 2013-08-09 – 2013-08-12 (×6): 3 mL via INTRAVENOUS

## 2013-08-07 MED ORDER — DEXTROSE 5 % IV SOLN
500.0000 mg | Freq: Once | INTRAVENOUS | Status: AC
  Administered 2013-08-07: 500 mg via INTRAVENOUS

## 2013-08-07 MED ORDER — DEXTROSE 5 % IV SOLN
1.0000 g | INTRAVENOUS | Status: DC
  Administered 2013-08-08 – 2013-08-09 (×2): 1 g via INTRAVENOUS
  Filled 2013-08-07 (×3): qty 10

## 2013-08-07 MED ORDER — DEXTROSE 5 % IV SOLN
500.0000 mg | INTRAVENOUS | Status: DC
  Administered 2013-08-08 – 2013-08-09 (×2): 500 mg via INTRAVENOUS
  Filled 2013-08-07 (×3): qty 500

## 2013-08-07 NOTE — Progress Notes (Signed)
   CARE MANAGEMENT ED NOTE 08/07/2013  Patient:  Tracey Wells, Tracey Wells   Account Number:  [id number]  Date Initiated:  08/07/2013  Documentation initiated by:  Edd Arbour  Subjective/Objective Assessment:   79 yr old female [employer] pt c/o fall dx with uti, pna  given iv rocepin iv zithromax ns at 75 cc/hr     Subjective/Objective Assessment Detail:   imaging= Severe multilevel degenerative disc disease as above, most  prominent at C4-5 and C5-6.  Stable atrophy with chronic microvascular ischemic disease and  remote bilateral basal ganglia lacunar infarcts  Patchy left perihilar airspace opacity, concerning for pneumonia. No  displaced rib fractures seen.     Action/Plan:   UR completed   Action/Plan Detail:   Anticipated DC Date:  08/10/2013     Status Recommendation to Physician:   Result of Recommendation:    Other ED Services  Consult Working Plan    DC Planning Services  Other    Choice offered to / List presented to:            Status of service:  Completed, signed off  ED Comments:   ED Comments Detail:

## 2013-08-07 NOTE — Progress Notes (Signed)
Pt transferred to [address] on [address] via bed accompanied by nurses X 2. Reviewed orders with Arlyss Gandy, RN prior to leaving [address].

## 2013-08-07 NOTE — Progress Notes (Signed)
INITIAL NUTRITION ASSESSMENT  DOCUMENTATION CODES Per approved criteria  -Not Applicable   INTERVENTION: Provide Ensure Complete once daily Provide Ensure Pudding once daily Provide Magic Cup once daily Provide Multivitamin with minerals daily  NUTRITION DIAGNOSIS: Inadequate oral intake related to constipation and poor appetite as evidenced by family report that pt eats about 25% of meals.   Goal: Pt to meet >/= 90% of their estimated nutrition needs   Monitor:  PO intake Weight Labs  Reason for Assessment: Malnutrition Screening Tool, score of 3  79 y.o. female  Admitting Dx: UTI (urinary tract infection)  ASSESSMENT: 79 y.o. female with a past medical history of hypertension, glaucoma, chronic constipation, hypothyroidism, seizure disorder who was in her usual state of health up until yesterday when the patient wasn't feeling well. In the emergency department she was noted to have an abnormal UA, and a possible pneumonia on chest x-ray.  Pt asleep at time of visit; pt's son at bedside provided information. Pt usually eats 3 small meals daily but, doesn't eat much; usually toast and coffee for breakfast, a quarter of a sandwich for lunch, and a small dinner. Pt eats normally for about 5 days until given medication for constipation after which she eats less for about 2 days. Pt's MD recommended pt drink Ensure supplements 3 times daily but, pt typically once drinks one per day. Son feels pt has lost some weight but, unsure how much.  Height: Ht Readings from Last 1 Encounters:  08/07/13 5\' 4"  (1.626 m)    Weight: Wt Readings from Last 1 Encounters:  08/07/13 117 lb (53.071 kg)    Ideal Body Weight: 120 lbs  % Ideal Body Weight: 98%  Wt Readings from Last 10 Encounters:  08/07/13 117 lb (53.071 kg)    Usual Body Weight: unknown  % Usual Body Weight: NA  BMI:  Body mass index is 20.07 kg/(m^2).  Estimated Nutritional Needs: Kcal: 1250-1450 Protein: 60-70  grams Fluid: 1.3-1.5 L/day  Skin: intact  Diet Order: Dysphagia  EDUCATION NEEDS: -No education needs identified at this time   Intake/Output Summary (Last 24 hours) at 08/07/13 1555 Last data filed at 08/07/13 1348  Gross per 24 hour  Intake      0 ml  Output    610 ml  Net   -610 ml    Last BM: 12/23  Labs:   Recent Labs Lab 08/07/13 0445 08/07/13 1230  NA 134*  --   K 3.5  --   CL 102  --   CO2 21  --   BUN 20  --   CREATININE 0.70 0.74  CALCIUM 7.9*  --   GLUCOSE 124*  --     CBG (last 3)  No results found for this basename: GLUCAP,  in the last 72 hours  Scheduled Meds: . [START ON 08/08/2013] azithromycin  500 mg Intravenous Q24H  . [START ON 08/08/2013] cefTRIAXone (ROCEPHIN)  IV  1 g Intravenous Q24H  . docusate sodium  100 mg Oral BID  . enoxaparin (LOVENOX) injection  40 mg Subcutaneous Q24H  . latanoprost  1 drop Left Eye QHS  . [START ON 08/08/2013] levothyroxine  150 mcg Oral QAC breakfast  . phenytoin  100 mg Oral TID  . sodium chloride  3 mL Intravenous Q12H    Continuous Infusions: . sodium chloride 75 mL/hr at 08/07/13 1227    Past Medical History  Diagnosis Date  . Seizures   . Stroke   . Myocardial infarction   .  Hypothyroidism   . Coronary artery disease   . H/O Bell's palsy   . GERD (gastroesophageal reflux disease)   . Hypertension     Past Surgical History  Procedure Laterality Date  . Cholecystectomy  07/02/2002  . Unilateral oophorectomy      "Benign reasons"  many years ago  . Coronary angioplasty      x 2    Ian Malkin RD, LDN Inpatient Clinical Dietitian Pager: [phone] After Hours Pager: [phone]

## 2013-08-07 NOTE — Evaluation (Signed)
Physical Therapy Evaluation Patient Details Name: Tracey Wells MRN: [id number] DOB: October 02, 1920 Today's Date: 08/07/2013 Time: 2130-8657 PT Time Calculation (min): 13 min  PT Assessment / Plan / Recommendation History of Present Illness  79 yo female admitted with fall, uti, pna.   Clinical Impression  On eval, pt required Min assist for mobility-able to ambulate ~75 feet with walker. Demonstrates general weakness and decreased activity tolerance. Recommend HHPT and 24 hour supervision/assist. May need walker if pt doesn't have one already.     PT Assessment  Patient needs continued PT services    Follow Up Recommendations  Home health PT;Supervision/Assistance - 24 hour    Does the patient have the potential to tolerate intense rehabilitation      Barriers to Discharge        Equipment Recommendations  Rolling walker with 5" wheels (if pt doesn't have one already)    Recommendations for Other Services OT consult   Frequency Min 3X/week    Precautions / Restrictions Precautions Precautions: Fall Restrictions Weight Bearing Restrictions: No   Pertinent Vitals/Pain No c/o pain      Mobility  Bed Mobility Bed Mobility: Supine to Sit;Sit to Supine Supine to Sit: 4: Min assist Sit to Supine: 4: Min assist Details for Bed Mobility Assistance: Assist for trunk and LEs. Increased time.  Transfers Transfers: Sit to Stand;Stand to Sit Sit to Stand: 4: Min assist;From bed Stand to Sit: 4: Min assist;To bed Details for Transfer Assistance: Assist to rise, stabilize, control descent Ambulation/Gait Ambulation/Gait Assistance: 4: Min assist Ambulation Distance (Feet): 75 Feet Assistive device: Rolling walker Ambulation/Gait Assistance Details: assist to stabilize throughout ambulation. VCs safety, posture. slow gait speed.  Gait Pattern: Step-through pattern;Trunk flexed    Exercises     PT Diagnosis: Difficulty walking;Generalized weakness  PT Problem List:  Decreased strength;Decreased activity tolerance;Decreased balance;Decreased mobility;Decreased knowledge of use of DME PT Treatment Interventions: DME instruction;Gait training;Functional mobility training;Therapeutic activities;Therapeutic exercise;Balance training;Wheelchair mobility training     PT Goals(Current goals can be found in the care plan section) Acute Rehab PT Goals Patient Stated Goal: home. feel better PT Goal Formulation: With patient/family Time For Goal Achievement: 08/21/13 Potential to Achieve Goals: Good  Visit Information  Last PT Received On: 08/07/13 Assistance Needed: +1 History of Present Illness: 79 yo female admitted with fall, uti, pna.        Prior Functioning  Home Living Family/patient expects to be discharged to:: Private residence Living Arrangements: Children (lives with daughter) Available Help at Discharge: Family Type of Home: House Home Access: Stairs to enter Secretary/administrator of Steps: 5 front, 3-garage Entrance Stairs-Rails: Right Home Layout: One level Home Equipment: Cane - single point (with small base "tripod" ) Prior Function Level of Independence: Needs assistance;Independent with assistive device(s) ADL's / Homemaking Assistance Needed: assist getting into/out of shower Communication Communication: No difficulties    Cognition  Cognition Arousal/Alertness: Lethargic Behavior During Therapy: WFL for tasks assessed/performed Overall Cognitive Status: Within Functional Limits for tasks assessed    Extremity/Trunk Assessment Upper Extremity Assessment Upper Extremity Assessment: Defer to OT evaluation Lower Extremity Assessment Lower Extremity Assessment: Generalized weakness Cervical / Trunk Assessment Cervical / Trunk Assessment: Normal   Balance    End of Session PT - End of Session Equipment Utilized During Treatment: Gait belt Activity Tolerance: Patient limited by fatigue Patient left: in bed;with call  bell/phone within reach;with family/visitor present  GP     Rebeca Alert, MPT Pager: [phone]

## 2013-08-07 NOTE — ED Provider Notes (Signed)
CSN: [id number]     Arrival date & time 08/07/13  0356 History   First MD Initiated Contact with Patient 08/07/13 0413     Chief Complaint  Patient presents with  . Fall   (Consider location/radiation/quality/duration/timing/severity/associated sxs/prior Treatment) HPI 79 year old female presents to emergency department via EMS from home after a fall.  Patient was found on the floor next her bed.  Failure reports she was incontinent of urine.  Patient has been having problems with strong smelling urine, and incontinence over last 2 days.  They did not realize she had a fever.  Patient denies any injury or complaint at this time.  She has had runny nose without cough over the last few days.  Patient was seen by her primary care Dr. on Friday for a checkup, no labs done at that time.  No nausea or vomiting.  Family reports she does not drink enough fluid.  He reports she has been a general decline since a fall in September.          Past Medical History  Diagnosis Date  . Seizures   . Stroke    No past surgical history on file. No family history on file. History  Substance Use Topics  . Smoking status: Never Smoker   . Smokeless tobacco: Not on file  . Alcohol Use: No   OB History   Grav Para Term Preterm Abortions TAB SAB Ect Mult Living                 Review of Systems  Unable to perform ROS: Mental status change    Allergies  Review of patient's allergies indicates no known allergies.  Home Medications   Current Outpatient Rx  Name  Route  Sig  Dispense  Refill  . amLODipine (NORVASC) 5 MG tablet   Oral   Take 5 mg by mouth daily.         Marland Kitchen aspirin EC 81 MG tablet   Oral   Take 81 mg by mouth daily.         . beta carotene w/minerals (OCUVITE) tablet   Oral   Take 1 tablet by mouth daily.         . cholecalciferol (VITAMIN D) 1000 UNITS tablet   Oral   Take 1,000 Units by mouth daily.         Marland Kitchen latanoprost (XALATAN) 0.005 % ophthalmic solution    Left Eye   Place 1 drop into the left eye at bedtime.         Marland Kitchen levothyroxine (SYNTHROID) 150 MCG tablet   Oral   Take 150 mcg by mouth daily before breakfast.         . metoprolol (LOPRESSOR) 50 MG tablet   Oral   Take 25 mg by mouth 2 (two) times daily. Takes 1/2 tab         . mirtazapine (REMERON) 15 MG tablet   Oral   Take 15 mg by mouth at bedtime.         . phenytoin (DILANTIN) 100 MG ER capsule   Oral   Take 100 mg by mouth 3 (three) times daily.         . ranitidine (ZANTAC) 150 MG tablet   Oral   Take 150 mg by mouth 2 (two) times daily.          BP 172/79  Pulse 87  Temp(Src) 102.6 F (39.2 C) (Rectal)  Resp 18  SpO2 93% Physical Exam  Nursing note and vitals reviewed. Constitutional: She appears well-developed and well-nourished.  Patient in no acute distress.  There is a strong smell of urine  HENT:  Head: Normocephalic and atraumatic.  Right Ear: External ear normal.  Left Ear: External ear normal.  Nose: Nose normal.  Mouth/Throat: Oropharynx is clear and moist.  Contusion to left parietal scalp  Eyes: Conjunctivae and EOM are normal. Pupils are equal, round, and reactive to light.  Neck: Normal range of motion. Neck supple. No JVD present. No tracheal deviation present. No thyromegaly present.  Cardiovascular: Normal rate, regular rhythm, normal heart sounds and intact distal pulses.  Exam reveals no gallop and no friction rub.   No murmur heard. Pulmonary/Chest: Effort normal and breath sounds normal. No stridor. No respiratory distress. She has no wheezes. She has no rales. She exhibits no tenderness.  Abdominal: Soft. Bowel sounds are normal. She exhibits no distension and no mass. There is no tenderness. There is no rebound and no guarding.  Musculoskeletal: Normal range of motion. She exhibits no edema and no tenderness.  Lymphadenopathy:    She has no cervical adenopathy.  Neurological: She is alert. She exhibits normal muscle tone.  Coordination normal.  Skin: Skin is warm and dry. No rash noted. No erythema. No pallor.  Psychiatric: She has a normal mood and affect. Her behavior is normal. Judgment and thought content normal.    ED Course  Procedures (including critical care time) Labs Review Labs Reviewed  CBC WITH DIFFERENTIAL - Abnormal; Notable for the following:    WBC 14.3 (*)    RBC 3.62 (*)    Hemoglobin 11.2 (*)    HCT 32.8 (*)    Neutrophils Relative % 86 (*)    Neutro Abs 12.4 (*)    Lymphocytes Relative 6 (*)    Monocytes Absolute 1.1 (*)    All other components within normal limits  BASIC METABOLIC PANEL - Abnormal; Notable for the following:    Sodium 134 (*)    Glucose, Bld 124 (*)    Calcium 7.9 (*)    GFR calc non Af Amer 74 (*)    GFR calc Af Amer 86 (*)    All other components within normal limits  URINALYSIS, ROUTINE W REFLEX MICROSCOPIC - Abnormal; Notable for the following:    APPearance CLOUDY (*)    Nitrite POSITIVE (*)    Leukocytes, UA TRACE (*)    All other components within normal limits  URINE MICROSCOPIC-ADD ON - Abnormal; Notable for the following:    Squamous Epithelial / LPF FEW (*)    Bacteria, UA MANY (*)    All other components within normal limits  CULTURE, BLOOD (ROUTINE X 2)  CULTURE, BLOOD (ROUTINE X 2)  URINE CULTURE  CG4 I-STAT (LACTIC ACID)   Imaging Review Dg Chest 1 View  08/07/2013   CLINICAL DATA:  Fever; status post fall.  EXAM: CHEST - 1 VIEW  COMPARISON:  Chest radiograph performed 09/22/2009  FINDINGS: Patchy left perihilar airspace opacity raises concern for pneumonia. No pleural effusion or pneumothorax is seen. The lungs are relatively well expanded.  The cardiomediastinal silhouette remains normal in size. There appears to be mild chronic loss of height along the lower thoracic spine, on evaluation of this frontal image. No acute osseous abnormalities are seen. Vague lucency at the left mid humerus is similar to that at the left humeral head and  is thought to remain within normal limits.  IMPRESSION: Patchy left perihilar airspace opacity, concerning  for pneumonia. No displaced rib fractures seen.   Electronically Signed   By: Roanna Raider M.D.   On: 08/07/2013 06:21   Ct Head Wo Contrast  08/07/2013   CLINICAL DATA:  Found down  EXAM: CT HEAD WITHOUT CONTRAST  CT CERVICAL SPINE WITHOUT CONTRAST  TECHNIQUE: Multidetector CT imaging of the head and cervical spine was performed following the standard protocol without intravenous contrast. Multiplanar CT image reconstructions of the cervical spine were also generated.  COMPARISON:  Prior study from 03/29/2013  FINDINGS: CT HEAD FINDINGS  Age-related atrophy with chronic microvascular ischemic disease is again noted, unchanged. Remote bilateral basal ganglia lacunar infarcts again noted.  No acute intracranial vessel territory infarct identified. No mass lesion, midline shift, hydrocephalus, or extra-axial fluid collection.  Prominent vascular calcifications are noted. Postoperative changes noted at the left globe.  Calvarium is intact. Mucoperiosteal thickening noted within the ethmoidal air cells. Minimal layering opacity noted within the left sphenoid sinus posteriorly. Paranasal sinuses are otherwise clear. No mastoid effusion.  CT CERVICAL SPINE FINDINGS  Trace anterolisthesis of C2 on C3 is present. Vertebral bodies are otherwise normally aligned with preservation of the normal cervical lordosis. Vertebral body heights are preserved. Normal C1-2 articulations are intact. Thin linear lucency through the anterior arch of C1 seen on series 7, image 14 appears chronic in nature. No prevertebral soft tissue swelling. No acute fracture listhesis.  Severe multilevel degenerative disc disease as evidenced by endplate sclerosis and osteophytosis is present, most severe at C4-5 and C5-6. The multilevel multi facet arthrosis is present, most severe at the C3-4 level on the left.  Visualized soft tissues are  within normal limits. Prominent atherosclerotic calcifications noted about the carotid bulbs bilaterally. Partially visualized lung apices are clear. Diffuse osteopenia noted.  IMPRESSION: CT BRAIN:  1. No acute intracranial process. 2. Stable atrophy with chronic microvascular ischemic disease and remote bilateral basal ganglia lacunar infarcts.  CT CERVICAL SPINE:  1. No acute fracture or malalignment identified within the cervical spine. 2. Severe multilevel degenerative disc disease as above, most prominent at C4-5 and C5-6.   Electronically Signed   By: Rise Mu M.D.   On: 08/07/2013 06:11   Ct Cervical Spine Wo Contrast  08/07/2013   CLINICAL DATA:  Found down  EXAM: CT HEAD WITHOUT CONTRAST  CT CERVICAL SPINE WITHOUT CONTRAST  TECHNIQUE: Multidetector CT imaging of the head and cervical spine was performed following the standard protocol without intravenous contrast. Multiplanar CT image reconstructions of the cervical spine were also generated.  COMPARISON:  Prior study from 03/29/2013  FINDINGS: CT HEAD FINDINGS  Age-related atrophy with chronic microvascular ischemic disease is again noted, unchanged. Remote bilateral basal ganglia lacunar infarcts again noted.  No acute intracranial vessel territory infarct identified. No mass lesion, midline shift, hydrocephalus, or extra-axial fluid collection.  Prominent vascular calcifications are noted. Postoperative changes noted at the left globe.  Calvarium is intact. Mucoperiosteal thickening noted within the ethmoidal air cells. Minimal layering opacity noted within the left sphenoid sinus posteriorly. Paranasal sinuses are otherwise clear. No mastoid effusion.  CT CERVICAL SPINE FINDINGS  Trace anterolisthesis of C2 on C3 is present. Vertebral bodies are otherwise normally aligned with preservation of the normal cervical lordosis. Vertebral body heights are preserved. Normal C1-2 articulations are intact. Thin linear lucency through the anterior  arch of C1 seen on series 7, image 14 appears chronic in nature. No prevertebral soft tissue swelling. No acute fracture listhesis.  Severe multilevel degenerative disc disease as evidenced  by endplate sclerosis and osteophytosis is present, most severe at C4-5 and C5-6. The multilevel multi facet arthrosis is present, most severe at the C3-4 level on the left.  Visualized soft tissues are within normal limits. Prominent atherosclerotic calcifications noted about the carotid bulbs bilaterally. Partially visualized lung apices are clear. Diffuse osteopenia noted.  IMPRESSION: CT BRAIN:  1. No acute intracranial process. 2. Stable atrophy with chronic microvascular ischemic disease and remote bilateral basal ganglia lacunar infarcts.  CT CERVICAL SPINE:  1. No acute fracture or malalignment identified within the cervical spine. 2. Severe multilevel degenerative disc disease as above, most prominent at C4-5 and C5-6.   Electronically Signed   By: Rise Mu M.D.   On: 08/07/2013 06:11    EKG Interpretation   None       MDM   1. Fever   2. Urinary tract infection   3. CAP (community acquired pneumonia)    79 year old female with fever and urinary incontinence, suspect urinary tract infection.  We'll get labs, plan for IV antibiotics and probable admission given age, fever and fall at home, associated with this illness.    Olivia Mackie, MD 08/07/13 [phone]

## 2013-08-07 NOTE — Evaluation (Signed)
Clinical/Bedside Swallow Evaluation Patient Details  Name: Tracey Wells MRN: [id number] Date of Birth: [birth year]  Today's Date: 08/07/2013 Time: 4098-1191 SLP Time Calculation (min): 28 min  Past Medical History:  Past Medical History  Diagnosis Date  . Seizures   . Stroke   . Myocardial infarction   . Hypothyroidism   . Coronary artery disease   . H/O Bell's palsy   . GERD (gastroesophageal reflux disease)   . Hypertension    Past Surgical History:  Past Surgical History  Procedure Laterality Date  . Cholecystectomy  07/02/2002  . Unilateral oophorectomy      "Benign reasons"  many years ago  . Coronary angioplasty      x 2   HPI:  79 yo female adm to [facility] after falling at home (found on floor), diagnosed with UTI.Marland Kitchen  CXR results were concerning for pna 12/23 and MD ordered swallow evaluation.  PMH + for remote basal ganglia cva per CT head 08/07/13, GERD, COPD, bell's palsy.  Son presents reports pt had a heart attack approx 23-24 years ago and Bell's Palsy, ? on left.  Per imaging pt also with osteophytes at C4-C5, C5-C6.  Pt reports that she feeds herself at home and denies dysphagia.  Son reports she has recently been chewing and spitting out food instead of swallowing.  Low grade fevers noted while in hospital.     Assessment / Plan / Recommendation Clinical Impression  Pt presents with mild clinical signs/symptoms of oropharyngeal dysphagia without s/s of aspiration.   Oral deficits primarly due to poor dentition - upper dentures and four lower teeth only with pt demonstrating an appropriate munching mastication pattern and delayed oral transiting.  Multiple swallows across consistency noted, ? oropharyngeal and/or esophageal stasis issues, however not overt coughing or symptoms of aspiration apparent.  Advised son and pt to precautions to mitigate dysphagia, with goal to increase efficiency of pt's swallow to allow adequate nutrition.   Pt admits to pill dysphagia,  provided alternative ways to take medication.  Xerostomia compensations also reviewd.  Pt denies symptoms of reflux but carries GERD dx and is on ranitidine BID.  All education completed, SLP to sign off. .     Aspiration Risk  Mild    Diet Recommendation Dysphagia 3 (Mechanical Soft);Thin liquid (ground meats)   Liquid Administration via: Cup;Straw Medication Administration:  (as tolerated, take with pudding and follow w/water if needed) Supervision: Patient able to self feed;Trained caregiver to feed patient Compensations: Slow rate;Small sips/bites;Check for pocketing Postural Changes and/or Swallow Maneuvers: Seated upright 90 degrees;Upright 30-60 min after meal    Other  Recommendations Oral Care Recommendations: Oral care BID   Follow Up Recommendations  None    Frequency and Duration     n/a   Pertinent Vitals/Pain Afebrile, decreased    SLP Swallow Goals  n/a   Swallow Study Prior Functional Status  Type of Home: House Available Help at Discharge: Family    General HPI: 79 yo female adm to [facility] after falling at home (found on floor), diagnosed with UTI.Marland Kitchen  CXR results were concerning for pna 12/23 and MD ordered swallow evaluation.  PMH + for remote basal ganglia cva per CT head 08/07/13, GERD, COPD, bell's palsy.  Son presents reports pt had a heart attack approx 23-24 years ago and Bell's Palsy, ? on left.  Per imaging pt also with osteophytes at C4-C5, C5-C6.  Pt reports that she feeds herself at home and denies dysphagia.  Son reports she  has recently been chewing and spitting out food instead of swallowing.  Low grade fevers noted while in hospital.   Type of Study: Bedside swallow evaluation Diet Prior to this Study: Dysphagia 3 (soft);Thin liquids Temperature Spikes Noted: Yes (low grade) Respiratory Status: Room air History of Recent Intubation: No Behavior/Cognition: Alert;Cooperative;Pleasant mood;Hard of hearing (sleepy but participative) Oral Cavity -  Dentition: Dentures, top;Missing dentition (4 lower teeth only, no partial) Self-Feeding Abilities: Able to feed self Patient Positioning: Upright in bed Baseline Vocal Quality: Clear Volitional Cough: Strong Volitional Swallow: Able to elicit    Oral/Motor/Sensory Function Overall Oral Motor/Sensory Function: Appears within functional limits for tasks assessed   Ice Chips Ice chips: Not tested   Thin Liquid Thin Liquid: Impaired Presentation: Straw Oral Phase Impairments: Reduced lingual movement/coordination (? minimal delay in oral transiting) Oral Phase Functional Implications: Prolonged oral transit Pharyngeal  Phase Impairments: Suspected delayed Swallow;Multiple swallows Other Comments: pt with audible swallow and she winces, ? discomfort, pt states "I know I just didn't swallow right"    Nectar Thick Nectar Thick Liquid: Not tested   Honey Thick Honey Thick Liquid: Not tested   Puree Puree: Impaired Presentation: Spoon Pharyngeal Phase Impairments: Multiple swallows   Solid   GO    Solid: Impaired Oral Phase Impairments: Impaired anterior to posterior transit Oral Phase Functional Implications:  (munching mastication pattern) Other Comments: softened graham cracker, munching mastication pattern due to pt's dentition, no oral stasis       Donavan Burnet, MS [facility] SLP [phone]

## 2013-08-07 NOTE — Progress Notes (Signed)
Dau called and notified that pt will be transferred  to 1429 per MD order r/t need for cardiac monitoring. Pt was notified by RN about her room transfer.

## 2013-08-07 NOTE — H&P (Signed)
[facility] History and Physical  Tracey Wells NWG:[id number] DOB: [birth year] DOA: 08/07/2013   PCP: Lillia Mountain, MD  Specialists: None  Chief Complaint: Fall  HPI: Tracey Wells is a 79 y.o. female with a past medical history of hypertension, glaucoma, chronic constipation, hypothyroidism, seizure disorder who was in her usual state of health up until yesterday when the patient wasn't feeling well. She was unable to specify any further. Today she is accompanied by her daughter and son, who provided most of the history. Apparently, patient had a visit with Dr. Valentina Lucks this past Friday, and everything was well at that time. At 3 AM this morning when the daughter was walking past her mother's room she heard moaning. And she peaked into the room. She found her lying on the floor next to her bed. She had been incontinent of urine. It is unknown if the patient had any syncopal episode. Mentally, she wasn't very responsive. EMS was called and there was a possibility that she may have hit her head so she was brought into the hospital. There's been no history of any cough. She was noted to have a temperature of 102 in the emergency department. There's been no history of diarrhea. On the other hand patient is chronically constipated. Every 5 days or so she takes magnesium citrate, following which she has explosive diarrhea. At that time she is extremely nauseous as well. Patient is lethargic, arousable, but unable to provide much history. There's been no sick contacts. She did get a flu shot this season. No weight loss recently. Apparently, she had a bad fall back in August and since then patient has been declining per her daughter. She has been getting physical and occupational therapy at home. In the emergency department she was noted to have an abnormal UA, and a possible pneumonia on chest x-ray.  Home Medications: Prior to Admission medications   Medication Sig Start Date End Date  Taking? Authorizing Provider  acetaminophen (TYLENOL) 500 MG tablet Take 500 mg by mouth every 6 (six) hours as needed (headache).   Yes Historical Provider, MD  amLODipine (NORVASC) 5 MG tablet Take 5 mg by mouth daily.   Yes Historical Provider, MD  aspirin EC 81 MG tablet Take 81 mg by mouth daily.   Yes Historical Provider, MD  beta carotene w/minerals (OCUVITE) tablet Take 1 tablet by mouth daily.   Yes Historical Provider, MD  cholecalciferol (VITAMIN D) 1000 UNITS tablet Take 1,000 Units by mouth daily.   Yes Historical Provider, MD  latanoprost (XALATAN) 0.005 % ophthalmic solution Place 1 drop into the left eye at bedtime.   Yes Historical Provider, MD  levothyroxine (SYNTHROID) 150 MCG tablet Take 150 mcg by mouth daily before breakfast.   Yes Historical Provider, MD  magnesium citrate SOLN Take 2 Bottles by mouth 5 days. Every 5-7 days approx.   Yes Historical Provider, MD  metoprolol (LOPRESSOR) 50 MG tablet Take 25 mg by mouth 2 (two) times daily. Takes 1/2 tab   Yes Historical Provider, MD  mirtazapine (REMERON) 15 MG tablet Take 15 mg by mouth at bedtime.   Yes Historical Provider, MD  phenytoin (DILANTIN) 100 MG ER capsule Take 100 mg by mouth 3 (three) times daily.   Yes Historical Provider, MD  ranitidine (ZANTAC) 150 MG tablet Take 150 mg by mouth 2 (two) times daily.   Yes Historical Provider, MD    Allergies: No Known Allergies  Past Medical History: Past Medical History  Diagnosis Date  .  Seizures   . Stroke   . Myocardial infarction   . Hypothyroidism   . Coronary artery disease   . H/O Bell's palsy   . GERD (gastroesophageal reflux disease)   . Hypertension     Past Surgical History  Procedure Laterality Date  . Cholecystectomy  07/02/2002  . Unilateral oophorectomy      "Benign reasons"  many years ago  . Coronary angioplasty      x 2    Social History: Patient lives with her daughter. Uses a cane to ambulate. No smoking, alcohol or illicit drug  use.  Family History:  Family History  Problem Relation Age of Onset  . Coronary artery disease Mother   . Stroke Brother      Review of Systems - Unable to obtain due to mental status/lethargy  Physical Examination  Filed Vitals:   08/07/13 0403 08/07/13 0756 08/07/13 0915 08/07/13 1010  BP: 172/79 137/61 125/70 122/54  Pulse: 87 88 86 82  Temp: 102.6 F (39.2 C) 99.1 F (37.3 C) 99.9 F (37.7 C) 98.8 F (37.1 C)  TempSrc: Rectal Axillary Oral Oral  Resp: 18 18 20 18   Height:    5\' 4"  (1.626 m)  Weight:    53.071 kg (117 lb)  SpO2: 93% 94% 99% 97%    General appearance: alert, appears stated age, distracted, fatigued and no distress Head: Normocephalic, without obvious abnormality, atraumatic Eyes: conjunctivae/corneas clear. PERRL, EOM's intact.  Throat: lips, mucosa, and tongue normal; teeth and gums normal Neck: no adenopathy, no carotid bruit, no JVD, supple, symmetrical, trachea midline and thyroid not enlarged, symmetric, no tenderness/mass/nodules Resp: decreased air entry at bases. No wheezing Cardio: regular rate and rhythm, S1, S2 normal, no murmur, click, rub or gallop GI: soft, non-tender; bowel sounds normal; no masses,  no organomegaly Extremities: extremities normal, atraumatic, no cyanosis or edema. Moving all extremities without limitation. Gen weakness appreciated. Pulses: 2+ and symmetric Skin: Skin color, texture, turgor normal. No rashes or lesions Lymph nodes: Cervical, supraclavicular, and axillary nodes normal. Neurologic: Fatigued but arousable. No cranial nerve deficits. No motor strength deficits in upper or lower extremities.  Laboratory Data: Results for orders placed during the hospital encounter of 08/07/13 (from the past 48 hour(s))  URINALYSIS, ROUTINE W REFLEX MICROSCOPIC     Status: Abnormal   Collection Time    08/07/13  4:16 AM      Result Value Range   Color, Urine YELLOW  YELLOW   APPearance CLOUDY (*) CLEAR   Specific  Gravity, Urine 1.013  1.005 - 1.030   pH 7.0  5.0 - 8.0   Glucose, UA NEGATIVE  NEGATIVE mg/dL   Hgb urine dipstick NEGATIVE  NEGATIVE   Bilirubin Urine NEGATIVE  NEGATIVE   Ketones, ur NEGATIVE  NEGATIVE mg/dL   Protein, ur NEGATIVE  NEGATIVE mg/dL   Urobilinogen, UA 1.0  0.0 - 1.0 mg/dL   Nitrite POSITIVE (*) NEGATIVE   Leukocytes, UA TRACE (*) NEGATIVE  URINE MICROSCOPIC-ADD ON     Status: Abnormal   Collection Time    08/07/13  4:16 AM      Result Value Range   Squamous Epithelial / LPF FEW (*) RARE   WBC, UA 0-2  <3 WBC/hpf   RBC / HPF 0-2  <3 RBC/hpf   Bacteria, UA MANY (*) RARE  CBC WITH DIFFERENTIAL     Status: Abnormal   Collection Time    08/07/13  4:45 AM      Result Value  Range   WBC 14.3 (*) 4.0 - 10.5 K/uL   RBC 3.62 (*) 3.87 - 5.11 MIL/uL   Hemoglobin 11.2 (*) 12.0 - 15.0 g/dL   HCT 56.2 (*) 13.0 - 86.5 %   MCV 90.6  78.0 - 100.0 fL   MCH 30.9  26.0 - 34.0 pg   MCHC 34.1  30.0 - 36.0 g/dL   RDW 78.4  69.6 - 29.5 %   Platelets 228  150 - 400 K/uL   Neutrophils Relative % 86 (*) 43 - 77 %   Neutro Abs 12.4 (*) 1.7 - 7.7 K/uL   Lymphocytes Relative 6 (*) 12 - 46 %   Lymphs Abs 0.8  0.7 - 4.0 K/uL   Monocytes Relative 8  3 - 12 %   Monocytes Absolute 1.1 (*) 0.1 - 1.0 K/uL   Eosinophils Relative 0  0 - 5 %   Eosinophils Absolute 0.0  0.0 - 0.7 K/uL   Basophils Relative 0  0 - 1 %   Basophils Absolute 0.0  0.0 - 0.1 K/uL  BASIC METABOLIC PANEL     Status: Abnormal   Collection Time    08/07/13  4:45 AM      Result Value Range   Sodium 134 (*) 135 - 145 mEq/L   Potassium 3.5  3.5 - 5.1 mEq/L   Chloride 102  96 - 112 mEq/L   CO2 21  19 - 32 mEq/L   Glucose, Bld 124 (*) 70 - 99 mg/dL   BUN 20  6 - 23 mg/dL   Creatinine, Ser 2.84  0.50 - 1.10 mg/dL   Calcium 7.9 (*) 8.4 - 10.5 mg/dL   GFR calc non Af Amer 74 (*) >90 mL/min   GFR calc Af Amer 86 (*) >90 mL/min   Comment: (NOTE)     The eGFR has been calculated using the CKD EPI equation.     This  calculation has not been validated in all clinical situations.     eGFR's persistently <90 mL/min signify possible Chronic Kidney     Disease.  CG4 I-STAT (LACTIC ACID)     Status: None   Collection Time    08/07/13  4:54 AM      Result Value Range   Lactic Acid, Venous 1.16  0.5 - 2.2 mmol/L    Radiology Reports: Dg Chest 1 View  08/07/2013   CLINICAL DATA:  Fever; status post fall.  EXAM: CHEST - 1 VIEW  COMPARISON:  Chest radiograph performed 09/22/2009  FINDINGS: Patchy left perihilar airspace opacity raises concern for pneumonia. No pleural effusion or pneumothorax is seen. The lungs are relatively well expanded.  The cardiomediastinal silhouette remains normal in size. There appears to be mild chronic loss of height along the lower thoracic spine, on evaluation of this frontal image. No acute osseous abnormalities are seen. Vague lucency at the left mid humerus is similar to that at the left humeral head and is thought to remain within normal limits.  IMPRESSION: Patchy left perihilar airspace opacity, concerning for pneumonia. No displaced rib fractures seen.   Electronically Signed   By: Roanna Raider M.D.   On: 08/07/2013 06:21   Ct Head Wo Contrast  08/07/2013   CLINICAL DATA:  Found down  EXAM: CT HEAD WITHOUT CONTRAST  CT CERVICAL SPINE WITHOUT CONTRAST  TECHNIQUE: Multidetector CT imaging of the head and cervical spine was performed following the standard protocol without intravenous contrast. Multiplanar CT image reconstructions of the cervical spine were  also generated.  COMPARISON:  Prior study from 03/29/2013  FINDINGS: CT HEAD FINDINGS  Age-related atrophy with chronic microvascular ischemic disease is again noted, unchanged. Remote bilateral basal ganglia lacunar infarcts again noted.  No acute intracranial vessel territory infarct identified. No mass lesion, midline shift, hydrocephalus, or extra-axial fluid collection.  Prominent vascular calcifications are noted. Postoperative  changes noted at the left globe.  Calvarium is intact. Mucoperiosteal thickening noted within the ethmoidal air cells. Minimal layering opacity noted within the left sphenoid sinus posteriorly. Paranasal sinuses are otherwise clear. No mastoid effusion.  CT CERVICAL SPINE FINDINGS  Trace anterolisthesis of C2 on C3 is present. Vertebral bodies are otherwise normally aligned with preservation of the normal cervical lordosis. Vertebral body heights are preserved. Normal C1-2 articulations are intact. Thin linear lucency through the anterior arch of C1 seen on series 7, image 14 appears chronic in nature. No prevertebral soft tissue swelling. No acute fracture listhesis.  Severe multilevel degenerative disc disease as evidenced by endplate sclerosis and osteophytosis is present, most severe at C4-5 and C5-6. The multilevel multi facet arthrosis is present, most severe at the C3-4 level on the left.  Visualized soft tissues are within normal limits. Prominent atherosclerotic calcifications noted about the carotid bulbs bilaterally. Partially visualized lung apices are clear. Diffuse osteopenia noted.  IMPRESSION: CT BRAIN:  1. No acute intracranial process. 2. Stable atrophy with chronic microvascular ischemic disease and remote bilateral basal ganglia lacunar infarcts.  CT CERVICAL SPINE:  1. No acute fracture or malalignment identified within the cervical spine. 2. Severe multilevel degenerative disc disease as above, most prominent at C4-5 and C5-6.   Electronically Signed   By: Rise Mu M.D.   On: 08/07/2013 06:11   Ct Cervical Spine Wo Contrast  08/07/2013   CLINICAL DATA:  Found down  EXAM: CT HEAD WITHOUT CONTRAST  CT CERVICAL SPINE WITHOUT CONTRAST  TECHNIQUE: Multidetector CT imaging of the head and cervical spine was performed following the standard protocol without intravenous contrast. Multiplanar CT image reconstructions of the cervical spine were also generated.  COMPARISON:  Prior study  from 03/29/2013  FINDINGS: CT HEAD FINDINGS  Age-related atrophy with chronic microvascular ischemic disease is again noted, unchanged. Remote bilateral basal ganglia lacunar infarcts again noted.  No acute intracranial vessel territory infarct identified. No mass lesion, midline shift, hydrocephalus, or extra-axial fluid collection.  Prominent vascular calcifications are noted. Postoperative changes noted at the left globe.  Calvarium is intact. Mucoperiosteal thickening noted within the ethmoidal air cells. Minimal layering opacity noted within the left sphenoid sinus posteriorly. Paranasal sinuses are otherwise clear. No mastoid effusion.  CT CERVICAL SPINE FINDINGS  Trace anterolisthesis of C2 on C3 is present. Vertebral bodies are otherwise normally aligned with preservation of the normal cervical lordosis. Vertebral body heights are preserved. Normal C1-2 articulations are intact. Thin linear lucency through the anterior arch of C1 seen on series 7, image 14 appears chronic in nature. No prevertebral soft tissue swelling. No acute fracture listhesis.  Severe multilevel degenerative disc disease as evidenced by endplate sclerosis and osteophytosis is present, most severe at C4-5 and C5-6. The multilevel multi facet arthrosis is present, most severe at the C3-4 level on the left.  Visualized soft tissues are within normal limits. Prominent atherosclerotic calcifications noted about the carotid bulbs bilaterally. Partially visualized lung apices are clear. Diffuse osteopenia noted.  IMPRESSION: CT BRAIN:  1. No acute intracranial process. 2. Stable atrophy with chronic microvascular ischemic disease and remote bilateral basal ganglia lacunar  infarcts.  CT CERVICAL SPINE:  1. No acute fracture or malalignment identified within the cervical spine. 2. Severe multilevel degenerative disc disease as above, most prominent at C4-5 and C5-6.   Electronically Signed   By: Rise Mu M.D.   On: 08/07/2013 06:11     Electrocardiogram: Pending  Problem List  Principal Problem:   UTI (urinary tract infection) Active Problems:   CAP (community acquired pneumonia)   Fever   Chronic constipation   Unspecified hypothyroidism   Seizure disorder   Assessment: This is a 79 year old, Caucasian female who had a fall last night. She hasn't sustained any head injury or other bony injuries  based on examination. She is noted to have a UTI and community acquired pneumonia. She appears to be dehydrated. Dilantin toxicity needs to be ruled out. She could have had a syncope. She was also febrile, so, influenza is a possibility as well  Plan: #1 community-acquired pneumonia: She'll be treated with ceftriaxone and azithromycin. Influenza panel has been ordered. Droplet precautions will be utilized. Swallow evaluation will be obtained considering her age.  #2 urinary tract infection: Above antibiotics should cover. Cultures will be followed up on.  #3 fever, most likely due to UTI or community acquired pneumonia. Influenza panel is pending. We'll give her gentle IV hydration for her dehydration.  #4 possible syncope: If she did have a syncopal episode this was most likely due to her infectious illness. CT head was unremarkable for acute finding. She is noted to have atrophy and remote infarcts. We'll get echocardiogram. Will check Dilantin level. Will check troponin and EKG.  #5 history of seizure disorder: Check dilantin level and if normal continue with oral Dilantin.  #6 history of chronic constipation: Give her a fleets enema today.  #7 history of hypothyroidism: Continue with Synthroid.  #8 history of coronary artery disease, and hypertension: Blood pressure is borderline low. Hold her oral agents for now. These can be readdressed tomorrow morning.   DVT Prophylaxis: Lovenox Code Status: CODE STATUS was discussed with the daughter, and the patient will be a full code. Daughter will bring in living  will. Family Communication: Discussed with the daughter and son  Disposition Plan: Admit to telemetry. Dr. Valentina Lucks to assume care in AM.  Further management decisions will depend on results of further testing and patient's response to treatment.  [facility]  [facility] Pager [phone]  If 7PM-7AM, please contact night-coverage [email or web address] Password [facility]  08/07/2013, 11:31 AM

## 2013-08-07 NOTE — ED Notes (Signed)
Bed: WA20 Expected date:  Expected time:  Means of arrival:  Comments: EMS 79yo F Fall

## 2013-08-07 NOTE — Progress Notes (Signed)
Report called to Arlyss Gandy, RN on [place] prior to transfer to [phone]. Recent vital signs, need for cardiac monitoring, droplet precaution orders, and recent medications given reported to nurse. Tracey Wells made aware fleets enema to be given with verbalized understanding and explained pt's usually home bowel regimen at home per daughter.

## 2013-08-07 NOTE — ED Notes (Signed)
Per EMS, patient lives at home with family. Family went in to check on the patient during the night and found on the floor in a supine position. Patient has recently become confused and incontinent according to family.

## 2013-08-08 LAB — COMPREHENSIVE METABOLIC PANEL
ALT: 6 U/L (ref 0–35)
Alkaline Phosphatase: 116 U/L (ref 39–117)
BUN: 22 mg/dL (ref 6–23)
CO2: 24 mEq/L (ref 19–32)
Chloride: 106 mEq/L (ref 96–112)
GFR calc Af Amer: 82 mL/min — ABNORMAL LOW (ref >90)
GFR calc non Af Amer: 71 mL/min — ABNORMAL LOW (ref >90)
Glucose, Bld: 88 mg/dL (ref 70–99)
Potassium: 3.3 mEq/L — ABNORMAL LOW (ref 3.5–5.1)
Sodium: 137 mEq/L (ref 135–145)
Total Bilirubin: 0.4 mg/dL (ref 0.3–1.2)
Total Protein: 5.2 g/dL — ABNORMAL LOW (ref 6.0–8.3)

## 2013-08-08 LAB — TROPONIN I: Troponin I: <0.3 ng/mL (ref <0.30)

## 2013-08-08 LAB — CBC
HCT: 26.6 % — ABNORMAL LOW (ref 36.0–46.0)
Hemoglobin: 8.9 g/dL — ABNORMAL LOW (ref 12.0–15.0)
MCHC: 33.5 g/dL (ref 30.0–36.0)
Platelets: 207 10*3/uL (ref 150–400)
RDW: 12.9 % (ref 11.5–15.5)

## 2013-08-08 LAB — LEGIONELLA ANTIGEN, URINE: Legionella Antigen, Urine: NEGATIVE

## 2013-08-08 LAB — RESPIRATORY VIRUS PANEL
Adenovirus: NOT DETECTED
Influenza A H1: NOT DETECTED
Influenza A H3: NOT DETECTED
Metapneumovirus: NOT DETECTED
Parainfluenza 2: NOT DETECTED
Respiratory Syncytial Virus A: NOT DETECTED
Respiratory Syncytial Virus B: NOT DETECTED
Rhinovirus: NOT DETECTED

## 2013-08-08 MED ORDER — FAMOTIDINE 20 MG PO TABS
20.0000 mg | ORAL_TABLET | Freq: Two times a day (BID) | ORAL | Status: DC
  Administered 2013-08-08 – 2013-08-12 (×9): 20 mg via ORAL
  Filled 2013-08-08 (×10): qty 1

## 2013-08-08 MED ORDER — ASPIRIN 81 MG PO CHEW
81.0000 mg | CHEWABLE_TABLET | Freq: Every day | ORAL | Status: DC
  Administered 2013-08-08 – 2013-08-12 (×5): 81 mg via ORAL
  Filled 2013-08-08 (×5): qty 1

## 2013-08-08 MED ORDER — METOPROLOL TARTRATE 25 MG PO TABS
25.0000 mg | ORAL_TABLET | Freq: Two times a day (BID) | ORAL | Status: DC
  Administered 2013-08-08 – 2013-08-12 (×9): 25 mg via ORAL
  Filled 2013-08-08 (×10): qty 1

## 2013-08-08 MED ORDER — KCL IN DEXTROSE-NACL 40-5-0.45 MEQ/L-%-% IV SOLN
INTRAVENOUS | Status: AC
  Administered 2013-08-08: 09:00:00 via INTRAVENOUS
  Filled 2013-08-08: qty 1000

## 2013-08-08 MED ORDER — MIRTAZAPINE 15 MG PO TABS
15.0000 mg | ORAL_TABLET | Freq: Every day | ORAL | Status: DC
  Administered 2013-08-08 – 2013-08-11 (×4): 15 mg via ORAL
  Filled 2013-08-08 (×5): qty 1

## 2013-08-08 NOTE — Progress Notes (Signed)
Subjective: Feeling much better  Objective: Vital signs in last 24 hours: Temp:  [90 F (32.2 C)-99.9 F (37.7 C)] 98 F (36.7 C) (12/24 0981) Pulse Rate:  [80-88] 80 (12/24 0608) Resp:  [18-20] 18 (12/24 0608) BP: (122-145)/(54-82) 145/68 mmHg (12/24 0608) SpO2:  [94 %-99 %] 97 % (12/24 0608) Weight:  [53.071 kg (117 lb)] 53.071 kg (117 lb) (12/23 1320) Weight change:  Last BM Date: 08/07/13  Intake/Output from previous day: 12/23 0701 - 12/24 0700 In: 531.3 [P.O.:340; I.V.:191.3] Out: 885 [Urine:885] Intake/Output this shift: Total I/O In: 100 [P.O.:100] Out: 275 [Urine:275]  General appearance: alert Resp: few crackles bilaterally Cardio: regular rate and rhythm, S1, S2 normal, no murmur, click, rub or gallop GI: soft, non-tender; bowel sounds normal; no masses,  no organomegaly Extremities: extremities normal, atraumatic, no cyanosis or edema  Lab Results:  Recent Labs  08/07/13 1230 08/08/13 0418  WBC 17.7* 11.0*  HGB 10.9* 8.9*  HCT 33.2* 26.6*  PLT 219 207   BMET  Recent Labs  08/07/13 0445 08/07/13 1230 08/08/13 0418  NA 134*  --  137  K 3.5  --  3.3*  CL 102  --  106  CO2 21  --  24  GLUCOSE 124*  --  88  BUN 20  --  22  CREATININE 0.70 0.74 0.79  CALCIUM 7.9*  --  7.6*    Studies/Results: Dg Chest 1 View  08/07/2013   CLINICAL DATA:  Fever; status post fall.  EXAM: CHEST - 1 VIEW  COMPARISON:  Chest radiograph performed 09/22/2009  FINDINGS: Patchy left perihilar airspace opacity raises concern for pneumonia. No pleural effusion or pneumothorax is seen. The lungs are relatively well expanded.  The cardiomediastinal silhouette remains normal in size. There appears to be mild chronic loss of height along the lower thoracic spine, on evaluation of this frontal image. No acute osseous abnormalities are seen. Vague lucency at the left mid humerus is similar to that at the left humeral head and is thought to remain within normal limits.  IMPRESSION:  Patchy left perihilar airspace opacity, concerning for pneumonia. No displaced rib fractures seen.   Electronically Signed   By: Roanna Raider M.D.   On: 08/07/2013 06:21   Ct Head Wo Contrast  08/07/2013   CLINICAL DATA:  Found down  EXAM: CT HEAD WITHOUT CONTRAST  CT CERVICAL SPINE WITHOUT CONTRAST  TECHNIQUE: Multidetector CT imaging of the head and cervical spine was performed following the standard protocol without intravenous contrast. Multiplanar CT image reconstructions of the cervical spine were also generated.  COMPARISON:  Prior study from 03/29/2013  FINDINGS: CT HEAD FINDINGS  Age-related atrophy with chronic microvascular ischemic disease is again noted, unchanged. Remote bilateral basal ganglia lacunar infarcts again noted.  No acute intracranial vessel territory infarct identified. No mass lesion, midline shift, hydrocephalus, or extra-axial fluid collection.  Prominent vascular calcifications are noted. Postoperative changes noted at the left globe.  Calvarium is intact. Mucoperiosteal thickening noted within the ethmoidal air cells. Minimal layering opacity noted within the left sphenoid sinus posteriorly. Paranasal sinuses are otherwise clear. No mastoid effusion.  CT CERVICAL SPINE FINDINGS  Trace anterolisthesis of C2 on C3 is present. Vertebral bodies are otherwise normally aligned with preservation of the normal cervical lordosis. Vertebral body heights are preserved. Normal C1-2 articulations are intact. Thin linear lucency through the anterior arch of C1 seen on series 7, image 14 appears chronic in nature. No prevertebral soft tissue swelling. No acute fracture listhesis.  Severe multilevel degenerative disc disease as evidenced by endplate sclerosis and osteophytosis is present, most severe at C4-5 and C5-6. The multilevel multi facet arthrosis is present, most severe at the C3-4 level on the left.  Visualized soft tissues are within normal limits. Prominent atherosclerotic  calcifications noted about the carotid bulbs bilaterally. Partially visualized lung apices are clear. Diffuse osteopenia noted.  IMPRESSION: CT BRAIN:  1. No acute intracranial process. 2. Stable atrophy with chronic microvascular ischemic disease and remote bilateral basal ganglia lacunar infarcts.  CT CERVICAL SPINE:  1. No acute fracture or malalignment identified within the cervical spine. 2. Severe multilevel degenerative disc disease as above, most prominent at C4-5 and C5-6.   Electronically Signed   By: Rise Mu M.D.   On: 08/07/2013 06:11   Ct Cervical Spine Wo Contrast  08/07/2013   CLINICAL DATA:  Found down  EXAM: CT HEAD WITHOUT CONTRAST  CT CERVICAL SPINE WITHOUT CONTRAST  TECHNIQUE: Multidetector CT imaging of the head and cervical spine was performed following the standard protocol without intravenous contrast. Multiplanar CT image reconstructions of the cervical spine were also generated.  COMPARISON:  Prior study from 03/29/2013  FINDINGS: CT HEAD FINDINGS  Age-related atrophy with chronic microvascular ischemic disease is again noted, unchanged. Remote bilateral basal ganglia lacunar infarcts again noted.  No acute intracranial vessel territory infarct identified. No mass lesion, midline shift, hydrocephalus, or extra-axial fluid collection.  Prominent vascular calcifications are noted. Postoperative changes noted at the left globe.  Calvarium is intact. Mucoperiosteal thickening noted within the ethmoidal air cells. Minimal layering opacity noted within the left sphenoid sinus posteriorly. Paranasal sinuses are otherwise clear. No mastoid effusion.  CT CERVICAL SPINE FINDINGS  Trace anterolisthesis of C2 on C3 is present. Vertebral bodies are otherwise normally aligned with preservation of the normal cervical lordosis. Vertebral body heights are preserved. Normal C1-2 articulations are intact. Thin linear lucency through the anterior arch of C1 seen on series 7, image 14 appears  chronic in nature. No prevertebral soft tissue swelling. No acute fracture listhesis.  Severe multilevel degenerative disc disease as evidenced by endplate sclerosis and osteophytosis is present, most severe at C4-5 and C5-6. The multilevel multi facet arthrosis is present, most severe at the C3-4 level on the left.  Visualized soft tissues are within normal limits. Prominent atherosclerotic calcifications noted about the carotid bulbs bilaterally. Partially visualized lung apices are clear. Diffuse osteopenia noted.  IMPRESSION: CT BRAIN:  1. No acute intracranial process. 2. Stable atrophy with chronic microvascular ischemic disease and remote bilateral basal ganglia lacunar infarcts.  CT CERVICAL SPINE:  1. No acute fracture or malalignment identified within the cervical spine. 2. Severe multilevel degenerative disc disease as above, most prominent at C4-5 and C5-6.   Electronically Signed   By: Rise Mu M.D.   On: 08/07/2013 06:11    Medications: I have reviewed the patient's current medications.  Assessment/Plan: Principal Problem:   CAP (community acquired pneumonia), day 2 rocephin and azithromycin. Blood culture pending. Mild volume depletion, give one liter IVF and replete potassium Active Problems:   Chronic constipation last bm 1 week ago,takes magnesium citrate 2 bottles every 5 days with dulcolax and with zofran, will hold on this feeling better   Unspecified hypothyroidism   Seizure disorder dilantin level ok   CAD/hypertension, amlodipine on hold   Disposition  PT/OT consult    LOS: 1 day   Tracey Wells JOSEPH 08/08/2013, 6:38 AM

## 2013-08-08 NOTE — Progress Notes (Signed)
Pt has had no urine output since 1400 12/23.. Bladder scan showed >445 ml. Pt is not complaining of discomfort. Abdomen is soft and non-tender. NP notified. New orders were placed. Will continue to monitor.

## 2013-08-08 NOTE — Evaluation (Signed)
Occupational Therapy Evaluation Patient Details Name: Tracey Wells MRN: [id number] DOB: 11-27-1920 Today's Date: 08/08/2013 Time: 4098-1191 OT Time Calculation (min): 30 min  OT Assessment / Plan / Recommendation History of present illness 79 yo female admitted with fall, uti, pna.    Clinical Impression   Pt presents to OT with decreased I with ADL activity . Pt will benefit from skilled OT to focus on simple ADL activity to decrease burden on family.  ( toilet transfer, clothing management, etc)    OT Assessment  Patient needs continued OT Services    Follow Up Recommendations  Home health OT;Supervision/Assistance - 24 hour             Frequency  Min 2X/week    Precautions / Restrictions Precautions Precautions: Fall Restrictions Weight Bearing Restrictions: No       ADL  Grooming: Supervision/safety;Set up Where Assessed - Grooming: Unsupported sitting Upper Body Bathing: Set up;Minimal assistance Where Assessed - Upper Body Bathing: Unsupported sitting Lower Body Bathing: Minimal assistance Where Assessed - Lower Body Bathing: Supported sit to stand Upper Body Dressing: Minimal assistance Where Assessed - Upper Body Dressing: Unsupported sitting Lower Body Dressing: Moderate assistance Where Assessed - Lower Body Dressing: Supported sit to stand Toilet Transfer: Minimal assistance Toilet Transfer Method: Sit to stand;Stand pivot Acupuncturist: Bedside commode Toileting - Clothing Manipulation and Hygiene: Minimal assistance Where Assessed - Toileting Clothing Manipulation and Hygiene: Standing ADL Comments: Pt agreed to get up in chair. Pt performed grooming and ordered lunch as she was [place]    OT Diagnosis: Generalized weakness  OT Problem List: Decreased strength;Decreased activity tolerance OT Treatment Interventions: Self-care/ADL training;Patient/family education;DME and/or AE instruction   OT Goals(Current goals can be found in the  care plan section) Acute Rehab OT Goals Patient Stated Goal: get back to doing at home OT Goal Formulation: With patient Time For Goal Achievement: 08/22/13 Potential to Achieve Goals: Good ADL Goals Pt Will Perform Grooming: with supervision;standing Pt Will Perform Upper Body Dressing: with supervision;sitting Pt Will Perform Lower Body Dressing: sit to/from stand;with supervision Pt Will Transfer to Toilet: with supervision;regular height toilet Pt Will Perform Toileting - Clothing Manipulation and hygiene: with supervision;sit to/from stand  Visit Information  Last OT Received On: 08/08/13 History of Present Illness: 79 yo female admitted with fall, uti, pna.        Prior Functioning     Home Living Family/patient expects to be discharged to:: Private residence Living Arrangements: Children (lives with daughter) Available Help at Discharge: Family Type of Home: House Home Access: Stairs to enter Secretary/administrator of Steps: 5 front, 3-garage Entrance Stairs-Rails: Right Home Layout: One level Home Equipment: Cane - single point (with small base "tripod" ) Prior Function Level of Independence: Needs assistance;Independent with assistive device(s) ADL's / Homemaking Assistance Needed: assist getting into/out of shower Communication Communication: No difficulties         Vision/Perception Vision - History Baseline Vision: Wears glasses all the time Patient Visual Report: No change from baseline   Cognition  Cognition Arousal/Alertness: Awake/alert Behavior During Therapy: WFL for tasks assessed/performed Overall Cognitive Status: Within Functional Limits for tasks assessed    Extremity/Trunk Assessment Upper Extremity Assessment Upper Extremity Assessment: Generalized weakness     Mobility Bed Mobility Bed Mobility: Supine to Sit;Sit to Supine Supine to Sit: 4: Min assist Sit to Supine: 4: Min assist Details for Bed Mobility Assistance: Assist for  trunk and LEs. Increased time.  Transfers Sit to Stand: 4: Min  assist;From bed;With upper extremity assist Stand to Sit: 4: Min assist;To bed;To chair/3-in-1;With upper extremity assist Details for Transfer Assistance: Assist to rise, stabilize, control descent           End of Session OT - End of Session Activity Tolerance: Patient tolerated treatment well Patient left: in chair;with call bell/phone within reach;with chair alarm set Nurse Communication: Mobility status  GO     Alba Cory 08/08/2013, 12:17 PM

## 2013-08-08 NOTE — Progress Notes (Addendum)
Spoke with pt's daughter Maloree Uplinger, concerning [employer] program with [employer]/Dr. Griffin's request. During this conversion it was revealed that  pt's daughter did call [employer] and she will call [employer] again concerning [employer] Case [employer]/Managed Care program for pt.  Thank you for the referral, MS. Angeliz Settlemyre is a very pleasant person. Pt may benefit with HHRN/NA/CSW/PT/OT. Will continue to follow for discharge needs.

## 2013-08-08 NOTE — Progress Notes (Signed)
Physical Therapy Treatment Patient Details Name: Tracey Wells MRN: [id number] DOB: September 10, 1920 Today's Date: 08/08/2013 Time: 1191-4782 PT Time Calculation (min): 25 min  PT Assessment / Plan / Recommendation  History of Present Illness 79 yo female admitted with fall, uti, pna.    PT Comments   Progressing slowly with mobility.   Follow Up Recommendations  Home health PT;Supervision/Assistance - 24 hour     Does the patient have the potential to tolerate intense rehabilitation     Barriers to Discharge        Equipment Recommendations  Rolling walker with 5" wheels    Recommendations for Other Services OT consult  Frequency Min 3X/week   Progress towards PT Goals Progress towards PT goals: Progressing toward goals  Plan Current plan remains appropriate    Precautions / Restrictions Precautions Precautions: Fall Restrictions Weight Bearing Restrictions: No   Pertinent Vitals/Pain No c/opain    Mobility  Bed Mobility Bed Mobility: Not assessed Supine to Sit: 4: Min assist Sit to Supine: 4: Min assist Details for Bed Mobility Assistance: pt sitting in recliner Transfers Transfers: Sit to Stand;Stand to Sit Sit to Stand: 4: Min assist;From bed;With upper extremity assist Stand to Sit: 4: Min assist;To bed;To chair/3-in-1;With upper extremity assist Details for Transfer Assistance: Assist to rise, stabilize, control descent Ambulation/Gait Ambulation/Gait Assistance: 4: Min assist Ambulation Distance (Feet): 75 Feet (15'x2) Assistive device: Rolling walker Ambulation/Gait Assistance Details: Assist to stabilize throughout ambulation. VCs safety, posture, slow gait speed. Fatigues easily. Seated rest break needed between walks.  Gait Pattern: Step-through pattern;Trunk flexed    Exercises     PT Diagnosis:    PT Problem List:   PT Treatment Interventions:     PT Goals (current goals can now be found in the care plan section) Acute Rehab PT Goals Patient  Stated Goal: get back to doing at home  Visit Information  Last PT Received On: 08/08/13 Assistance Needed: +1 History of Present Illness: 79 yo female admitted with fall, uti, pna.     Subjective Data  Patient Stated Goal: get back to doing at home   Cognition  Cognition Arousal/Alertness: Awake/alert Behavior During Therapy: WFL for tasks assessed/performed Overall Cognitive Status: Within Functional Limits for tasks assessed    Balance     End of Session PT - End of Session Activity Tolerance: Patient limited by fatigue Patient left: in chair;with call bell/phone within reach;with family/visitor present   GP     Rebeca Alert, MPT Pager: [phone]

## 2013-08-09 DIAGNOSIS — J189 Pneumonia, unspecified organism: Principal | ICD-10-CM

## 2013-08-09 DIAGNOSIS — R509 Fever, unspecified: Secondary | ICD-10-CM

## 2013-08-09 LAB — CBC
HCT: 29.3 % — ABNORMAL LOW (ref 36.0–46.0)
MCH: 30.4 pg (ref 26.0–34.0)
MCHC: 33.4 g/dL (ref 30.0–36.0)
MCV: 91 fL (ref 78.0–100.0)
Platelets: 199 10*3/uL (ref 150–400)
RBC: 3.22 MIL/uL — ABNORMAL LOW (ref 3.87–5.11)
RDW: 12.8 % (ref 11.5–15.5)

## 2013-08-09 LAB — BASIC METABOLIC PANEL
BUN: 17 mg/dL (ref 6–23)
Calcium: 7.8 mg/dL — ABNORMAL LOW (ref 8.4–10.5)
GFR calc non Af Amer: 72 mL/min — ABNORMAL LOW (ref >90)
Sodium: 137 mEq/L (ref 135–145)

## 2013-08-09 LAB — URINE CULTURE: Colony Count: >=100000

## 2013-08-09 NOTE — Progress Notes (Signed)
Subjective: Mrs. Starry is being treated for CAP.   Objective: Lab:  Recent Labs  08/07/13 0445 08/07/13 1230 08/08/13 0418 08/09/13 0346  WBC 14.3* 17.7* 11.0* 8.7  NEUTROABS 12.4*  --   --   --   HGB 11.2* 10.9* 8.9* 9.8*  HCT 32.8* 33.2* 26.6* 29.3*  MCV 90.6 92.2 92.4 91.0  PLT 228 219 207 199    Recent Labs  08/07/13 0445 08/07/13 1230 08/08/13 0418 08/09/13 0346  NA 134*  --  137 137  K 3.5  --  3.3* 4.0  CL 102  --  106 106  GLUCOSE 124*  --  88 84  BUN 20  --  22 17  CREATININE 0.70 0.74 0.79 0.76  CALCIUM 7.9*  --  7.6* 7.8*    Imaging:  Scheduled Meds: . aspirin  81 mg Oral Daily  . azithromycin  500 mg Intravenous Q24H  . cefTRIAXone (ROCEPHIN)  IV  1 g Intravenous Q24H  . docusate sodium  100 mg Oral BID  . enoxaparin (LOVENOX) injection  40 mg Subcutaneous Q24H  . famotidine  20 mg Oral BID  . feeding supplement (ENSURE COMPLETE)  237 mL Oral Q24H  . feeding supplement (ENSURE)  1 Container Oral Q24H  . latanoprost  1 drop Left Eye QHS  . levothyroxine  150 mcg Oral QAC breakfast  . metoprolol tartrate  25 mg Oral BID  . mirtazapine  15 mg Oral QHS  . multivitamin with minerals  1 tablet Oral Daily  . phenytoin  100 mg Oral TID  . sodium chloride  3 mL Intravenous Q12H   Continuous Infusions:  PRN Meds:.acetaminophen, acetaminophen, albuterol, ondansetron (ZOFRAN) IV, ondansetron   Physical Exam: Filed Vitals:   08/09/13 0933  BP: 135/66  Pulse:   Temp:   Resp:     Intake/Output Summary (Last 24 hours) at 08/09/13 1220 Last data filed at 08/09/13 1100  Gross per 24 hour  Intake 1054.17 ml  Output    850 ml  Net 204.17 ml   Gen'l - Spry nonogenarian in no distress Cor - 2+ radial, RRR Pulm - normal, no increased WOB      Assessment/Plan: 1. CAP - Day #3 Rocephin/azithromycin. Doing well. Plan- continue present regimen   Illene Regulus [facility] (o) [phone]; (c) [phone] Call-grp - [facility]  Tele:  [phone]  08/09/2013, 12:17 PM

## 2013-08-10 MED ORDER — LEVOFLOXACIN 500 MG PO TABS
500.0000 mg | ORAL_TABLET | Freq: Every day | ORAL | Status: DC
  Administered 2013-08-10 – 2013-08-12 (×3): 500 mg via ORAL
  Filled 2013-08-10 (×3): qty 1

## 2013-08-10 NOTE — Progress Notes (Signed)
Physical Therapy Treatment Patient Details Name: GILLIE FLEITES MRN: [id number] DOB: [birth year] Today's Date: 08/10/2013 Time: 4098-1191 PT Time Calculation (min): 24 min  PT Assessment / Plan / Recommendation  History of Present Illness 79 yo female admitted with fall, uti, pna.    PT Comments   Pt tolerated session well today , ambulated to and from the bathroom with min guard and RW .   Follow Up Recommendations  Home health PT;Supervision/Assistance - 24 hour     Does the patient have the potential to tolerate intense rehabilitation     Barriers to Discharge        Equipment Recommendations  Rolling walker with 5" wheels    Recommendations for Other Services    Frequency Min 3X/week   Progress towards PT Goals Progress towards PT goals: Progressing toward goals  Plan Current plan remains appropriate    Precautions / Restrictions Precautions Precautions: Fall   Pertinent Vitals/Pain No c/o pain     Mobility  Bed Mobility Bed Mobility: Supine to Sit;Sit to Supine Supine to Sit: 4: Min assist Sit to Supine: 4: Min assist Transfers Sit to Stand: 4: Min guard Stand to Sit: 4: Min guard Ambulation/Gait Ambulation/Gait Assistance: 4: Min guard Ambulation Distance (Feet): 100 Feet Assistive device: Rolling walker Ambulation/Gait Assistance Details: cues for RW safety and proper use as well as guidnace to prevent getting too close to wall on right side Gait Pattern: Within Functional Limits Gait velocity: slow with flexed trunk posture    Exercises     PT Diagnosis:    PT Problem List:   PT Treatment Interventions:     PT Goals (current goals can now be found in the care plan section) Acute Rehab PT Goals PT Goal Formulation: With patient/family Time For Goal Achievement: 08/21/13  Visit Information  Last PT Received On: 08/10/13 Assistance Needed: +1 History of Present Illness: 79 yo female admitted with fall, uti, pna.     Subjective Data  Subjective:  I would love to get up and move around a little   Cognition  Cognition Arousal/Alertness: Awake/alert Behavior During Therapy: WFL for tasks assessed/performed Overall Cognitive Status: Within Functional Limits for tasks assessed    Balance     End of Session PT - End of Session Equipment Utilized During Treatment: Gait belt Activity Tolerance: Patient tolerated treatment well Patient left: in bed;with call bell/phone within reach;with bed alarm set Nurse Communication: Mobility status   GP     Marella Bile 08/10/2013, 2:20 PM Marella Bile, PT Pager: [phone] 08/10/2013

## 2013-08-10 NOTE — Progress Notes (Signed)
Utilization review completed.  

## 2013-08-10 NOTE — Progress Notes (Signed)
Subjective: Feels better  Objective: Vital signs in last 24 hours: Temp:  [97.7 F (36.5 C)-98.6 F (37 C)] 98.6 F (37 C) (12/25 2034) Pulse Rate:  [75-87] 75 (12/25 2342) Resp:  [16-20] 20 (12/25 2034) BP: (135-150)/(57-71) 150/71 mmHg (12/25 2342) SpO2:  [95 %-96 %] 96 % (12/25 2034) Weight change:  Last BM Date: 08/09/13  Intake/Output from previous day: 12/25 0701 - 12/26 0700 In: 970 [P.O.:420; IV Piggyback:550] Out: 350 [Urine:350] Intake/Output this shift:    General appearance: alert and cooperative Resp: clear to auscultation bilaterally Cardio: regular rate and rhythm, S1, S2 normal, no murmur, click, rub or gallop GI: soft, non-tender; bowel sounds normal; no masses,  no organomegaly Extremities: extremities normal, atraumatic, no cyanosis or edema  Lab Results:  Recent Labs  08/08/13 0418 08/09/13 0346  WBC 11.0* 8.7  HGB 8.9* 9.8*  HCT 26.6* 29.3*  PLT 207 199   BMET  Recent Labs  08/08/13 0418 08/09/13 0346  NA 137 137  K 3.3* 4.0  CL 106 106  CO2 24 22  GLUCOSE 88 84  BUN 22 17  CREATININE 0.79 0.76  CALCIUM 7.6* 7.8*    Studies/Results: No results found.  Medications: I have reviewed the patient's current medications.  Assessment/Plan: Principal Problem:  CAP (community acquired pneumonia), day 4 rocephin and azithromycin. Change to po antibiotics Active Problems:  Chronic constipation will wait until discharge before giving laxatives Unspecified hypothyroidism  Seizure disorder dilantin level ok  CAD/hypertension, amlodipine on hold  Disposition possible discharge later today with HHOT/PT   LOS: 3 days   Deaisha Welborn JOSEPH 08/10/2013, 5:43 AM

## 2013-08-11 NOTE — Progress Notes (Signed)
Assessment/Plan: Principal Problem:   CAP (community acquired pneumonia) - steadily improving. Dr. Valentina Lucks has consulted SW. In meantime, I will ask nursing staff on floor to keep her active and perhaps she will improve to point that dtr thinks she can go home.  Active Problems:   Chronic constipation   Unspecified hypothyroidism   Seizure disorder   Subjective: Patient feels good and would like to go home. I noted that Dr. Valentina Lucks had spoken to dtr yesterday and dtr did not think she could handle her in her current state. I note OT and PT notes.   Objective:  Vital Signs: Filed Vitals:   08/10/13 1420 08/10/13 2001 08/11/13 0456 08/11/13 0617  BP: 130/61 142/69 178/75 130/71  Pulse: 71 68 71 75  Temp: 97.9 F (36.6 C) 98.6 F (37 C) 98.5 F (36.9 C)   TempSrc: Oral Oral Oral   Resp: 18 20 16    Height:      Weight:      SpO2: 98% 96% 97%      EXAM: Alert, comfortable, sitting in chair.    Intake/Output Summary (Last 24 hours) at 08/11/13 0945 Last data filed at 08/11/13 1610  Gross per 24 hour  Intake    600 ml  Output    300 ml  Net    300 ml    Lab Results:  Recent Labs  08/09/13 0346  NA 137  K 4.0  CL 106  CO2 22  GLUCOSE 84  BUN 17  CREATININE 0.76  CALCIUM 7.8*   No results found for this basename: AST, ALT, ALKPHOS, BILITOT, PROT, ALBUMIN,  in the last 72 hours No results found for this basename: LIPASE, AMYLASE,  in the last 72 hours  Recent Labs  08/09/13 0346  WBC 8.7  HGB 9.8*  HCT 29.3*  MCV 91.0  PLT 199   No results found for this basename: CKTOTAL, CKMB, CKMBINDEX, TROPONINI,  in the last 72 hours BNP No results found for this basename: probnp   No results found for this basename: DDIMER,  in the last 72 hours No results found for this basename: HGBA1C,  in the last 72 hours No results found for this basename: CHOL, HDL, LDLCALC, TRIG, CHOLHDL, LDLDIRECT,  in the last 72 hours No results found for this basename: TSH, T4TOTAL,  FREET3, T3FREE, THYROIDAB,  in the last 72 hours No results found for this basename: VITAMINB12, FOLATE, FERRITIN, TIBC, IRON, RETICCTPCT,  in the last 72 hours  Studies/Results: No results found. Medications: Medications administered in the last 24 hours reviewed.  Current Medication List reviewed.    LOS: 4 days   [facility] @ [facility] [phone]) 08/11/2013, 9:45 AM

## 2013-08-11 NOTE — Progress Notes (Signed)
Occupational Therapy Treatment Patient Details Name: Tracey Wells MRN: [id number] DOB: [birth year] Today's Date: 08/11/2013 Time: 7829-5621 OT Time Calculation (min): 45 min  OT Assessment / Plan / Recommendation  History of present illness 79 yo female admitted with fall, uti, pna.    OT comments  Pt performed bathing and dressing at a supervision for safety level.  Fatigues in standing after 5 minutes grooming, so sat on 3 in 1 at sink to complete bath.  Pt will have supervision of her daughter at home.  Daughter not available to instruct.    Follow Up Recommendations  Home health OT;Supervision/Assistance - 24 hour    Barriers to Discharge       Equipment Recommendations       Recommendations for Other Services    Frequency Min 2X/week   Progress towards OT Goals Progress towards OT goals: Goals met and updated - see care plan  Plan Discharge plan remains appropriate    Precautions / Restrictions Precautions Precautions: Fall Restrictions Weight Bearing Restrictions: No   Pertinent Vitals/Pain VSS, no pain    ADL  Grooming: Wash/dry hands;Wash/dry face;Denture care;Teeth care;Supervision/safety Where Assessed - Grooming: Unsupported standing Upper Body Bathing: Supervision/safety Where Assessed - Upper Body Bathing: Unsupported sitting Lower Body Bathing: Supervision/safety Where Assessed - Lower Body Bathing: Unsupported sitting;Supported standing Upper Body Dressing: Set up Where Assessed - Upper Body Dressing: Unsupported sitting Lower Body Dressing: Supervision/safety Where Assessed - Lower Body Dressing: Unsupported sitting;Supported sit to stand Toilet Transfer: Supervision/safety Toilet Transfer Method: Sit to Barista: Regular height toilet Toileting - Clothing Manipulation and Hygiene: Supervision/safety Where Assessed - Engineer, mining and Hygiene: Sit to stand from 3-in-1 or toilet Equipment Used: Rolling  walker Transfers/Ambulation Related to ADLs: supervision with RW within room ADL Comments: Pt wants to go home.  Agreeable to any activity that will facilitate.    OT Diagnosis:    OT Problem List:   OT Treatment Interventions:     OT Goals(current goals can now be found in the care plan section) Acute Rehab OT Goals Patient Stated Goal: get back to doing at home  Visit Information  Last OT Received On: 08/11/13 Assistance Needed: +1 History of Present Illness: 79 yo female admitted with fall, uti, pna.     Subjective Data      Prior Functioning       Cognition  Cognition Arousal/Alertness: Awake/alert Behavior During Therapy: WFL for tasks assessed/performed Overall Cognitive Status: Within Functional Limits for tasks assessed Memory: Decreased short-term memory    Mobility  Bed Mobility Bed Mobility: Supine to Sit Supine to Sit: 6: Modified independent (Device/Increase time);HOB elevated Transfers Transfers: Sit to Stand;Stand to Sit Sit to Stand: 5: Supervision;With upper extremity assist;From bed;From chair/3-in-1 Stand to Sit: 5: Supervision;With upper extremity assist;To chair/3-in-1    Exercises      Balance     End of Session OT - End of Session Activity Tolerance: Patient tolerated treatment well Patient left: in chair;with call bell/phone within reach;with chair alarm set  GO     Evern Bio 08/11/2013, 9:20 AM \\[phone]

## 2013-08-12 MED ORDER — LEVOFLOXACIN 500 MG PO TABS: 500.0000 mg | ORAL_TABLET | Freq: Every day | ORAL | Status: DC

## 2013-08-12 NOTE — Progress Notes (Signed)
Pt discharged to home. DC instructions given with dtr at bedside. Prescription x 1 given for antibiotic. No concerns voiced. Left unit in wheelchair pushed by nurse tech. Left in good condition. Pt's dtr signed discharge as pt has poor eye sight. Vwilliams,rn.

## 2013-08-12 NOTE — Progress Notes (Signed)
Patient did well walking in hall yesterday with staff. Talked with her dtr who thinks she will be able to care for her at home now.   D/C home. Meds called to [facility].

## 2013-08-13 LAB — CULTURE, BLOOD (ROUTINE X 2): Culture: NO GROWTH

## 2013-09-06 NOTE — Discharge Summary (Signed)
Physician Discharge Summary  Patient ID: Tracey SharpsBetty V Oplinger MRN: [id number] DOB/AGE: 78-Oct-1922 78 y.o.  Admit date: 08/07/2013 Discharge date: 09/06/2013  Admission Diagnoses:  Community-acquired pneumonia Chronic constipation Hypothyroidism Seizure disorder Hypertension  Discharge Diagnoses:  Principal Problem:   CAP (community acquired pneumonia) Active Problems:   Chronic constipation   Unspecified hypothyroidism   Seizure disorder Hypertension   Discharged Condition: good  Hospital Course: The patient was admitted on December 23, the day of admission the patient had been found lying on the floor incontinent of urine with altered mental status. In emergency department she had temperatures 102 with some lethargy but was arousable. White count was 14,000. Urinalysis negative. Chest x-ray showed patchy left perihilar airspace opacity suggesting pneumonia. The patient was treated with IV antibiotics. Clinically she steadily improved she was seen by physical therapy. Home health was arranged and she was discharged on December 28.  Consults: None  Significant Diagnostic Studies: labs: Sodium 137, potassium 4.0, BUN 17, creatinine 0.76,, WC 8.7 microbiology: Legionella and Streptococcus pneumoniae urinary antigen negative and radiology: CXR: infiltrates: middle lobe on the left  Treatments: IV hydration, antibiotics: ceftriaxone and azithromycin and therapies: PT and OT  Discharge Exam: Blood pressure 167/74, pulse 76, temperature 98.4 F (36.9 C), temperature source Oral, resp. rate 16, height 5\' 4"  (1.626 m), weight 53.071 kg (117 lb), SpO2 97.00%. General appearance: alert and cooperative Resp: clear to auscultation bilaterally  Disposition: 01-Home or Self Care  Discharge Orders   Future Orders Complete By Expires   Diet - low sodium heart healthy  As directed    Increase activity slowly  As directed        Medication List         acetaminophen 500 MG tablet   Commonly known as:  TYLENOL  Take 500 mg by mouth every 6 (six) hours as needed (headache).     amLODipine 5 MG tablet  Commonly known as:  NORVASC  Take 5 mg by mouth daily.     aspirin EC 81 MG tablet  Take 81 mg by mouth daily.     beta carotene w/minerals tablet  Take 1 tablet by mouth daily.     cholecalciferol 1000 UNITS tablet  Commonly known as:  VITAMIN D  Take 1,000 Units by mouth daily.     latanoprost 0.005 % ophthalmic solution  Commonly known as:  XALATAN  Place 1 drop into the left eye at bedtime.     levofloxacin 500 MG tablet  Commonly known as:  LEVAQUIN  Take 1 tablet (500 mg total) by mouth daily.     magnesium citrate Soln  Take 2 Bottles by mouth 5 days. Every 5-7 days approx.     metoprolol 50 MG tablet  Commonly known as:  LOPRESSOR  Take 25 mg by mouth 2 (two) times daily. Takes 1/2 tab     mirtazapine 15 MG tablet  Commonly known as:  REMERON  Take 15 mg by mouth at bedtime.     phenytoin 100 MG ER capsule  Commonly known as:  DILANTIN  Take 100 mg by mouth 3 (three) times daily.     ranitidine 150 MG tablet  Commonly known as:  ZANTAC  Take 150 mg by mouth 2 (two) times daily.     SYNTHROID 150 MCG tablet  Generic drug:  levothyroxine  Take 150 mcg by mouth daily before breakfast.         Signed: Lillia MountainGRIFFIN,Majestic Molony JOSEPH 09/06/2013, 8:10 AM

## 2013-09-13 ENCOUNTER — Ambulatory Visit (HOSPITAL_COMMUNITY)
Admission: RE | Admit: 2013-09-13 | Discharge: 2013-09-13 | Disposition: A | Discharge status: Home / Self Care | Payer: Medicare Other | Source: Ambulatory Visit | Attending: Internal Medicine | Admitting: Internal Medicine

## 2013-09-13 ENCOUNTER — Other Ambulatory Visit (HOSPITAL_COMMUNITY): Payer: Self-pay | Admitting: Internal Medicine

## 2013-09-13 DIAGNOSIS — M7989 Other specified soft tissue disorders: Secondary | ICD-10-CM

## 2013-09-13 DIAGNOSIS — M79609 Pain in unspecified limb: Secondary | ICD-10-CM

## 2013-09-13 NOTE — Progress Notes (Signed)
VASCULAR LAB PRELIMINARY  PRELIMINARY  PRELIMINARY  PRELIMINARY  Left lower extremity venous Doppler completed.    Preliminary report:  There is no DVT or SVT noted in the left lower extremity.  Johnmark Geiger, RVT 09/13/2013, 4:45 PM

## 2015-01-31 ENCOUNTER — Other Ambulatory Visit (HOSPITAL_COMMUNITY): Payer: Self-pay | Admitting: Internal Medicine

## 2015-01-31 DIAGNOSIS — R1314 Dysphagia, pharyngoesophageal phase: Secondary | ICD-10-CM

## 2015-02-11 ENCOUNTER — Ambulatory Visit (HOSPITAL_COMMUNITY)
Admission: RE | Admit: 2015-02-11 | Discharge: 2015-02-11 | Disposition: A | Discharge status: Home / Self Care | Payer: Self-pay | Source: Ambulatory Visit | Attending: Internal Medicine | Admitting: Internal Medicine

## 2015-02-11 ENCOUNTER — Ambulatory Visit (HOSPITAL_COMMUNITY)
Admission: RE | Admit: 2015-02-11 | Discharge: 2015-02-11 | Disposition: A | Discharge status: Home / Self Care | Payer: Medicare Other | Source: Ambulatory Visit | Attending: Internal Medicine | Admitting: Internal Medicine

## 2015-02-11 DIAGNOSIS — F0390 Unspecified dementia without behavioral disturbance: Secondary | ICD-10-CM | POA: Diagnosis not present

## 2015-02-11 DIAGNOSIS — R1314 Dysphagia, pharyngoesophageal phase: Secondary | ICD-10-CM

## 2015-02-11 DIAGNOSIS — R1310 Dysphagia, unspecified: Secondary | ICD-10-CM | POA: Insufficient documentation

## 2015-02-11 DIAGNOSIS — R1312 Dysphagia, oropharyngeal phase: Secondary | ICD-10-CM | POA: Insufficient documentation

## 2015-02-11 NOTE — Evaluation (Signed)
  Speech Pathology     MBS complete. Full report located under chart review in imaging section and click on DG swallow function for results and recommendations.     Breck CoonsLisa Willis VerdigreLitaker M.Ed [employer]-SLP Pager [phone]

## 2015-10-14 ENCOUNTER — Emergency Department (HOSPITAL_COMMUNITY): Payer: Medicare Other

## 2015-10-14 ENCOUNTER — Inpatient Hospital Stay (HOSPITAL_COMMUNITY)
Admission: EM | Admit: 2015-10-14 | Discharge: 2015-10-17 | DRG: 308 | Disposition: A | Discharge status: Skilled Nursing Facility | Payer: Medicare Other | Attending: Internal Medicine | Admitting: Internal Medicine

## 2015-10-14 ENCOUNTER — Encounter (HOSPITAL_COMMUNITY): Payer: Self-pay | Admitting: Neurology

## 2015-10-14 DIAGNOSIS — E039 Hypothyroidism, unspecified: Secondary | ICD-10-CM | POA: Diagnosis present

## 2015-10-14 DIAGNOSIS — J90 Pleural effusion, not elsewhere classified: Secondary | ICD-10-CM | POA: Diagnosis not present

## 2015-10-14 DIAGNOSIS — I2510 Atherosclerotic heart disease of native coronary artery without angina pectoris: Secondary | ICD-10-CM | POA: Diagnosis present

## 2015-10-14 DIAGNOSIS — R627 Adult failure to thrive: Secondary | ICD-10-CM | POA: Diagnosis present

## 2015-10-14 DIAGNOSIS — I5031 Acute diastolic (congestive) heart failure: Secondary | ICD-10-CM | POA: Insufficient documentation

## 2015-10-14 DIAGNOSIS — Z8673 Personal history of transient ischemic attack (TIA), and cerebral infarction without residual deficits: Secondary | ICD-10-CM | POA: Diagnosis not present

## 2015-10-14 DIAGNOSIS — G40909 Epilepsy, unspecified, not intractable, without status epilepticus: Secondary | ICD-10-CM | POA: Diagnosis present

## 2015-10-14 DIAGNOSIS — Z515 Encounter for palliative care: Secondary | ICD-10-CM | POA: Diagnosis not present

## 2015-10-14 DIAGNOSIS — Z66 Do not resuscitate: Secondary | ICD-10-CM | POA: Diagnosis present

## 2015-10-14 DIAGNOSIS — N182 Chronic kidney disease, stage 2 (mild): Secondary | ICD-10-CM | POA: Diagnosis present

## 2015-10-14 DIAGNOSIS — I252 Old myocardial infarction: Secondary | ICD-10-CM | POA: Diagnosis not present

## 2015-10-14 DIAGNOSIS — I509 Heart failure, unspecified: Secondary | ICD-10-CM

## 2015-10-14 DIAGNOSIS — R1310 Dysphagia, unspecified: Secondary | ICD-10-CM | POA: Diagnosis present

## 2015-10-14 DIAGNOSIS — J9601 Acute respiratory failure with hypoxia: Secondary | ICD-10-CM | POA: Diagnosis present

## 2015-10-14 DIAGNOSIS — Z9861 Coronary angioplasty status: Secondary | ICD-10-CM | POA: Diagnosis not present

## 2015-10-14 DIAGNOSIS — I4891 Unspecified atrial fibrillation: Principal | ICD-10-CM | POA: Diagnosis present

## 2015-10-14 DIAGNOSIS — J189 Pneumonia, unspecified organism: Secondary | ICD-10-CM

## 2015-10-14 DIAGNOSIS — K5909 Other constipation: Secondary | ICD-10-CM | POA: Diagnosis present

## 2015-10-14 DIAGNOSIS — D649 Anemia, unspecified: Secondary | ICD-10-CM | POA: Diagnosis present

## 2015-10-14 DIAGNOSIS — K219 Gastro-esophageal reflux disease without esophagitis: Secondary | ICD-10-CM | POA: Diagnosis present

## 2015-10-14 DIAGNOSIS — F0390 Unspecified dementia without behavioral disturbance: Secondary | ICD-10-CM | POA: Diagnosis present

## 2015-10-14 DIAGNOSIS — Z7982 Long term (current) use of aspirin: Secondary | ICD-10-CM

## 2015-10-14 DIAGNOSIS — Z79899 Other long term (current) drug therapy: Secondary | ICD-10-CM | POA: Diagnosis not present

## 2015-10-14 DIAGNOSIS — I130 Hypertensive heart and chronic kidney disease with heart failure and stage 1 through stage 4 chronic kidney disease, or unspecified chronic kidney disease: Secondary | ICD-10-CM | POA: Diagnosis present

## 2015-10-14 DIAGNOSIS — Z7189 Other specified counseling: Secondary | ICD-10-CM | POA: Diagnosis not present

## 2015-10-14 HISTORY — DX: Headache: R51

## 2015-10-14 HISTORY — DX: Unspecified atrial fibrillation: I48.91

## 2015-10-14 HISTORY — DX: Family history of other specified conditions: Z84.89

## 2015-10-14 HISTORY — DX: Pneumonia, unspecified organism: J18.9

## 2015-10-14 HISTORY — DX: Headache, unspecified: R51.9

## 2015-10-14 HISTORY — DX: Unspecified osteoarthritis, unspecified site: M19.90

## 2015-10-14 HISTORY — DX: Repeated falls: R29.6

## 2015-10-14 LAB — URINALYSIS, ROUTINE W REFLEX MICROSCOPIC
BILIRUBIN URINE: NEGATIVE
Glucose, UA: NEGATIVE mg/dL
HGB URINE DIPSTICK: NEGATIVE
KETONES UR: 15 mg/dL — AB
NITRITE: NEGATIVE
PH: 5.5 (ref 5.0–8.0)
Protein, ur: 30 mg/dL — AB
SPECIFIC GRAVITY, URINE: 1.028 (ref 1.005–1.030)

## 2015-10-14 LAB — CBC
HCT: 35.9 % — ABNORMAL LOW (ref 36.0–46.0)
Hemoglobin: 11.6 g/dL — ABNORMAL LOW (ref 12.0–15.0)
MCH: 29.7 pg (ref 26.0–34.0)
MCHC: 32.3 g/dL (ref 30.0–36.0)
MCV: 92.1 fL (ref 78.0–100.0)
PLATELETS: 266 10*3/uL (ref 150–400)
RBC: 3.9 MIL/uL (ref 3.87–5.11)
RDW: 14.2 % (ref 11.5–15.5)
WBC: 6.7 10*3/uL (ref 4.0–10.5)

## 2015-10-14 LAB — PROCALCITONIN

## 2015-10-14 LAB — BRAIN NATRIURETIC PEPTIDE: B Natriuretic Peptide: 943.3 pg/mL — ABNORMAL HIGH (ref 0.0–100.0)

## 2015-10-14 LAB — BASIC METABOLIC PANEL
Anion gap: 11 (ref 5–15)
BUN: 22 mg/dL — AB (ref 6–20)
CHLORIDE: 111 mmol/L (ref 101–111)
CO2: 22 mmol/L (ref 22–32)
Calcium: 8.8 mg/dL — ABNORMAL LOW (ref 8.9–10.3)
Creatinine, Ser: 0.95 mg/dL (ref 0.44–1.00)
GFR calc non Af Amer: 50 mL/min — ABNORMAL LOW (ref >60)
GFR, EST AFRICAN AMERICAN: 58 mL/min — AB (ref >60)
Glucose, Bld: 105 mg/dL — ABNORMAL HIGH (ref 65–99)
Potassium: 4.2 mmol/L (ref 3.5–5.1)
Sodium: 144 mmol/L (ref 135–145)

## 2015-10-14 LAB — INFLUENZA PANEL BY PCR (TYPE A & B)
H1N1FLUPCR: NOT DETECTED
Influenza A By PCR: NEGATIVE
Influenza B By PCR: NEGATIVE

## 2015-10-14 LAB — TSH: TSH: 3.013 u[IU]/mL (ref 0.350–4.500)

## 2015-10-14 LAB — I-STAT TROPONIN, ED: Troponin i, poc: 0.02 ng/mL (ref 0.00–0.08)

## 2015-10-14 LAB — I-STAT CG4 LACTIC ACID, ED: Lactic Acid, Venous: 1.15 mmol/L (ref 0.5–2.0)

## 2015-10-14 LAB — URINE MICROSCOPIC-ADD ON

## 2015-10-14 LAB — MRSA PCR SCREENING: MRSA by PCR: NEGATIVE

## 2015-10-14 MED ORDER — ACETAMINOPHEN 325 MG PO TABS: 650.0000 mg | ORAL_TABLET | ORAL | Status: DC | PRN

## 2015-10-14 MED ORDER — DILTIAZEM HCL 100 MG IV SOLR
5.0000 mg/h | Freq: Once | INTRAVENOUS | Status: AC
  Administered 2015-10-14: 5 mg/h via INTRAVENOUS
  Filled 2015-10-14: qty 100

## 2015-10-14 MED ORDER — OCUVITE PO TABS
1.0000 | ORAL_TABLET | Freq: Every day | ORAL | Status: DC
  Administered 2015-10-14 – 2015-10-15 (×2): 1 via ORAL
  Filled 2015-10-14 (×3): qty 1

## 2015-10-14 MED ORDER — ASPIRIN EC 81 MG PO TBEC
81.0000 mg | DELAYED_RELEASE_TABLET | Freq: Every day | ORAL | Status: DC
  Administered 2015-10-14 – 2015-10-17 (×4): 81 mg via ORAL
  Filled 2015-10-14 (×4): qty 1

## 2015-10-14 MED ORDER — DILTIAZEM HCL 25 MG/5ML IV SOLN
10.0000 mg | Freq: Once | INTRAVENOUS | Status: AC
  Administered 2015-10-14: 10 mg via INTRAVENOUS
  Filled 2015-10-14: qty 5

## 2015-10-14 MED ORDER — DILTIAZEM HCL 100 MG IV SOLR
5.0000 mg/h | INTRAVENOUS | Status: DC
  Administered 2015-10-14 – 2015-10-15 (×2): 7.5 mg/h via INTRAVENOUS
  Administered 2015-10-15: 5 mg/h via INTRAVENOUS
  Filled 2015-10-14 (×2): qty 100

## 2015-10-14 MED ORDER — FUROSEMIDE 10 MG/ML IJ SOLN
40.0000 mg | Freq: Once | INTRAMUSCULAR | Status: AC
  Administered 2015-10-14: 40 mg via INTRAVENOUS
  Filled 2015-10-14: qty 4

## 2015-10-14 MED ORDER — FAMOTIDINE 20 MG PO TABS
20.0000 mg | ORAL_TABLET | Freq: Every day | ORAL | Status: DC
  Administered 2015-10-14 – 2015-10-17 (×4): 20 mg via ORAL
  Filled 2015-10-14 (×5): qty 1

## 2015-10-14 MED ORDER — LATANOPROST 0.005 % OP SOLN
1.0000 [drp] | Freq: Every day | OPHTHALMIC | Status: DC
  Administered 2015-10-14 – 2015-10-16 (×3): 1 [drp] via OPHTHALMIC
  Filled 2015-10-14: qty 2.5

## 2015-10-14 MED ORDER — ACETAMINOPHEN 500 MG PO TABS: 500.0000 mg | ORAL_TABLET | Freq: Four times a day (QID) | ORAL | Status: DC | PRN

## 2015-10-14 MED ORDER — HEPARIN BOLUS VIA INFUSION
2700.0000 [IU] | Freq: Once | INTRAVENOUS | Status: AC
  Administered 2015-10-14: 2700 [IU] via INTRAVENOUS
  Filled 2015-10-14: qty 2700

## 2015-10-14 MED ORDER — ONDANSETRON HCL 4 MG/2ML IJ SOLN: 4.0000 mg | Freq: Four times a day (QID) | INTRAMUSCULAR | Status: DC | PRN

## 2015-10-14 MED ORDER — PHENYTOIN SODIUM EXTENDED 100 MG PO CAPS
300.0000 mg | ORAL_CAPSULE | Freq: Every day | ORAL | Status: DC
  Administered 2015-10-14 – 2015-10-16 (×3): 300 mg via ORAL
  Filled 2015-10-14 (×4): qty 3

## 2015-10-14 MED ORDER — IOHEXOL 300 MG/ML  SOLN
75.0000 mL | Freq: Once | INTRAMUSCULAR | Status: AC | PRN
  Administered 2015-10-14: 75 mL via INTRAVENOUS

## 2015-10-14 MED ORDER — DILTIAZEM HCL 25 MG/5ML IV SOLN
10.0000 mg | Freq: Once | INTRAVENOUS | Status: AC
  Administered 2015-10-14: 10 mg via INTRAVENOUS

## 2015-10-14 MED ORDER — LEVOTHYROXINE SODIUM 25 MCG PO TABS
125.0000 ug | ORAL_TABLET | Freq: Every day | ORAL | Status: DC
  Administered 2015-10-15 – 2015-10-17 (×3): 125 ug via ORAL
  Filled 2015-10-14 (×4): qty 1

## 2015-10-14 MED ORDER — DEXTROSE 5 % IV SOLN
500.0000 mg | Freq: Once | INTRAVENOUS | Status: DC
  Filled 2015-10-14: qty 500

## 2015-10-14 MED ORDER — MIRTAZAPINE 15 MG PO TABS
15.0000 mg | ORAL_TABLET | Freq: Every day | ORAL | Status: DC
  Administered 2015-10-14 – 2015-10-16 (×3): 15 mg via ORAL
  Filled 2015-10-14 (×4): qty 1

## 2015-10-14 MED ORDER — FUROSEMIDE 20 MG PO TABS
20.0000 mg | ORAL_TABLET | Freq: Every day | ORAL | Status: DC
  Administered 2015-10-14: 20 mg via ORAL
  Filled 2015-10-14: qty 1

## 2015-10-14 MED ORDER — DEXTROSE 5 % IV SOLN
1.0000 g | Freq: Once | INTRAVENOUS | Status: AC
  Administered 2015-10-14: 1 g via INTRAVENOUS
  Filled 2015-10-14: qty 10

## 2015-10-14 MED ORDER — VITAMIN D3 25 MCG (1000 UNIT) PO TABS
1000.0000 [IU] | ORAL_TABLET | Freq: Every day | ORAL | Status: DC
  Administered 2015-10-14 – 2015-10-17 (×4): 1000 [IU] via ORAL
  Filled 2015-10-14 (×11): qty 1

## 2015-10-14 MED ORDER — HEPARIN (PORCINE) IN NACL 100-0.45 UNIT/ML-% IJ SOLN
750.0000 [IU]/h | INTRAMUSCULAR | Status: DC
  Administered 2015-10-14: 750 [IU]/h via INTRAVENOUS
  Filled 2015-10-14: qty 250

## 2015-10-14 MED ORDER — LEVOTHYROXINE SODIUM 75 MCG PO TABS: 150.0000 ug | ORAL_TABLET | Freq: Every day | ORAL | Status: DC

## 2015-10-14 NOTE — Progress Notes (Signed)
10/14/2015 Patient transfer from emergency room to [facility] at 1820. She is aware that she is in the hospital, person, but not sure of the time. Patient have purplish bruise on left lower leg, skin tear on the left upper arm with a foam dressing, on patient vaginal area wart on labia, also heels dry. Patient came up from the emergency room with Cardizem drip. She was place on the monitor she is a-fib. She receive chg bath, MRSA swab was done and was place on droplet to r/o flu and respiratory panel. [facility] RN.

## 2015-10-14 NOTE — H&P (Signed)
[facility] History and Physical                                                                                    Tracey Wells, is a 82 y.o. female  MRN: [id number]   DOB - Sep 26, 1920  Admit Date - 10/14/2015  Outpatient Primary MD for the patient is Lillia Mountain, MD  Referring MD: Fayrene Fearing / ER  Consulting M.D: [facility] Cardiology; Interventional Radiology  PMH: Past Medical History  Diagnosis Date  . Seizures (HCC)   . Stroke (HCC)   . Myocardial infarction (HCC)   . Hypothyroidism   . Coronary artery disease   . H/O Bell's palsy   . GERD (gastroesophageal reflux disease)   . Hypertension       PSH: Past Surgical History  Procedure Laterality Date  . Cholecystectomy  07/02/2002  . Unilateral oophorectomy      "Benign reasons"  many years ago  . Coronary angioplasty      x 2     CC:  Chief Complaint  Patient presents with  . Weakness     HPI: 82 year old female patient with past medical history of progressive dementia with associated dysphagia, remote history CAD, hypothyroidism, seizure disorder, chronic constipation, hypertension and stroke who presents to the hospital with progressive failure to thrive symptoms for at least one week. Patient has slowly stopped eating and drinking for the past 1 week and has been complaining of generalized weakness, shortness of breath at rest as well as significant dyspnea on exertion. She also had nonproductive paroxysmal coughing episodes with pleuritic chest pain as well as some mild substernal chest pain. No awareness of palpitations. No definitive fevers or chills although on one occasion the daughter thought the patient's skin "felt hot to the touch" she attempted to check her temperature but the patient had just ingested fluids. The patient has also been reporting significant nausea. Baseline the patient is on a chopped diet and all medicines must be crushed and administered and yogurt. The daughter states that 2  close friends to the patient is contact with has had upper respiratory symptoms and one was formally diagnosed with influenza.  ER Evaluation and treatment: Oral temperature 97.5, BP 127/95, pulse has ranged from 85-128 bpm, respirations 20-24 breaths per minute, room air saturations 93% up to 98% on nasal cannula oxygen EKG: Atrial fibrillation with ventricular rate 128 bpm, QTC 487 ms, nonspecific ST segment changes in inferior lateral leads with isolated Q waves in lead 3 not as prominent and aVF. Portable chest x-ray: Central mild vascular congestion and mild perihilar interstitial prominence suspicious for mild pulmonary edema. Small left pleural effusion with left lower lobe atelectasis or infiltrate CT of chest with contrast: Moderate to large bilateral pleural effusions with associated compressive type atelectasis and consolidation of both lobes. Thoracic kyphosis Laboratory data: Na 144, K 4.2, BUN 22, Cr 0.95, glucose 105, troponin 0.02, lactic acid 1.15, WBC 6700 with platelets 266,000, hemoglobin 11.6, urinalysis hazy in appearance with many bacteria and 15 ketones with trace leukocytes with elevated specific gravity 1.028, 0-5 wbc's; blood cultures have been obtained in the ER Rocephin 1 g IV 1 Zithromax 500  mg IV 1 dose ordered but subsequent canceled by admitting team Cardizem bolus 10 mg IV 1 Lasix 20 mg oral tablet Cardizem infusion at 5 mg per hour  Review of Systems   In addition to the HPI above,  No Fever-chills, myalgias or other constitutional symptoms No Headache, changes with Vision or hearing, new weakness, tingling, numbness in any extremity, No palpitations, orthopnea  No Abdominal pain, emesis, melena or hematochezia, no dark tarry stools No dysuria, hematuria or flank pain No new skin rashes, lesions, masses or bruises, No new joints pains-aches No recent weight gain or loss No polyuria, polydypsia or polyphagia,  *A full 10 point Review of Systems was done,  except as stated above, all other Review of Systems were negative.  Social History Social History  Substance Use Topics  . Smoking status: Never Smoker   . Smokeless tobacco: Never Used  . Alcohol Use: No    Resides at: Private residence  Lives with: Daughter  Ambulatory status: Minimal assistance   Family History Family History  Problem Relation Age of Onset  . Coronary artery disease Mother   . Stroke Brother      Prior to Admission medications   Medication Sig Start Date End Date Taking? Authorizing Provider  amLODipine (NORVASC) 5 MG tablet Take 5 mg by mouth daily.   Yes Historical Provider, MD  Lactobacillus (PROBIOTIC ACIDOPHILUS PO) Take 1 capsule by mouth daily.   Yes Historical Provider, MD  latanoprost (XALATAN) 0.005 % ophthalmic solution Place 1 drop into the left eye at bedtime.   Yes Historical Provider, MD  levothyroxine (SYNTHROID, LEVOTHROID) 125 MCG tablet Take 125 mcg by mouth daily before breakfast.   Yes Historical Provider, MD  LUTEIN-ZEAXANTHIN PO Take 1 capsule by mouth daily.   Yes Historical Provider, MD  Magnesium-Potassium (MAGNESIUM FIZZ-PLUS PO) Take 1 scoop by mouth daily.   Yes Historical Provider, MD  mirtazapine (REMERON) 15 MG tablet Take 15 mg by mouth at bedtime.   Yes Historical Provider, MD  phenytoin (DILANTIN) 100 MG ER capsule Take 300 mg by mouth at bedtime.    Yes Historical Provider, MD  ranitidine (ZANTAC) 150 MG tablet Take 150 mg by mouth 2 (two) times daily.   Yes Historical Provider, MD  acetaminophen (TYLENOL) 500 MG tablet Take 500 mg by mouth every 6 (six) hours as needed (headache).    Historical Provider, MD  aspirin EC 81 MG tablet Take 81 mg by mouth daily.    Historical Provider, MD  beta carotene w/minerals (OCUVITE) tablet Take 1 tablet by mouth daily.    Historical Provider, MD  cholecalciferol (VITAMIN D) 1000 UNITS tablet Take 1,000 Units by mouth daily.    Historical Provider, MD  levofloxacin (LEVAQUIN) 500 MG  tablet Take 1 tablet (500 mg total) by mouth daily. Patient not taking: Reported on 10/14/2015 08/12/13   Benjaman Kindler, MD  levothyroxine (SYNTHROID) 150 MCG tablet Take 150 mcg by mouth daily before breakfast.    Historical Provider, MD  magnesium citrate SOLN Take 2 Bottles by mouth 5 days. Every 5-7 days approx.    Historical Provider, MD  metoprolol (LOPRESSOR) 50 MG tablet Take 25 mg by mouth 2 (two) times daily. Takes 1/2 tab    Historical Provider, MD    No Known Allergies  Physical Exam  Vitals  Blood pressure 131/81, pulse 103, temperature 98.6 F (37 C), temperature source Rectal, resp. rate 18, SpO2 96 %.   General:  In no acute distress, appears stated age  Psych:  Normal affect, Denies Suicidal or Homicidal ideations, Awake Alert, Oriented X 2.   Neuro:   No focal neurological deficits, CN II through XII intact, Strength 5/5 all 4 extremities, Sensation intact all 4 extremities.  ENT:  Ears and Eyes appear Normal, Conjunctivae clear, PER. Moist oral mucosa without erythema or exudates.  Neck:  Supple, No lymphadenopathy appreciated  Respiratory:  Symmetrical chest wall movement, decreased air movement bilaterally primarily in the bases, CTAB. 2 L oxygen  Cardiac: Irregular with underlying atrial fibrillation, No Murmurs, no LE edema noted, no JVD, No carotid bruits, peripheral pulses palpable at 2+  Abdomen:  Positive bowel sounds, Soft, Non tender, Non distended,  No masses appreciated, no obvious hepatosplenomegaly  Skin:  No Cyanosis, Normal Skin Turgor, No Skin Rash or Bruise.  Extremities: Symmetrical without obvious trauma or injury,  no effusions.  Data Review  CBC  Recent Labs Lab 10/14/15 1121  WBC 6.7  HGB 11.6*  HCT 35.9*  PLT 266  MCV 92.1  MCH 29.7  MCHC 32.3  RDW 14.2    Chemistries   Recent Labs Lab 10/14/15 1121  NA 144  K 4.2  CL 111  CO2 22  GLUCOSE 105*  BUN 22*  CREATININE 0.95  CALCIUM 8.8*    CrCl cannot be  calculated (Unknown ideal weight.).  No results for input(s): TSH, T4TOTAL, T3FREE, THYROIDAB in the last 72 hours.  Invalid input(s): FREET3  Coagulation profile No results for input(s): INR, PROTIME in the last 168 hours.  No results for input(s): DDIMER in the last 72 hours.  Cardiac Enzymes No results for input(s): CKMB, TROPONINI, MYOGLOBIN in the last 168 hours.  Invalid input(s): CK  Invalid input(s): POCBNP  Urinalysis    Component Value Date/Time   COLORURINE YELLOW 10/14/2015 1307   APPEARANCEUR HAZY* 10/14/2015 1307   LABSPEC 1.028 10/14/2015 1307   PHURINE 5.5 10/14/2015 1307   GLUCOSEU NEGATIVE 10/14/2015 1307   HGBUR NEGATIVE 10/14/2015 1307   BILIRUBINUR NEGATIVE 10/14/2015 1307   KETONESUR 15* 10/14/2015 1307   PROTEINUR 30* 10/14/2015 1307   UROBILINOGEN 1.0 08/07/2013 0416   NITRITE NEGATIVE 10/14/2015 1307   LEUKOCYTESUR TRACE* 10/14/2015 1307    Imaging results:   Ct Chest W Contrast  10/14/2015  CLINICAL DATA:  Severe shortness of Breath and cough. EXAM: CT CHEST WITH CONTRAST TECHNIQUE: Multidetector CT imaging of the chest was performed during intravenous contrast administration. CONTRAST:  75mL OMNIPAQUE IOHEXOL 300 MG/ML  SOLN COMPARISON:  None FINDINGS: Mediastinum: The heart size is normal. Aortic atherosclerosis is identified. No pericardial effusion. Calcification within the RCA and LAD coronary arteries noted. The trachea appears patent and is midline. Unremarkable appearance of the esophagus. Lungs/Pleura: Moderate to large bilateral pleural effusions identified. There is subsegmental atelectasis within the lingula. Compressive type atelectasis and consolidation is noted within both lower lobes. Upper Abdomen: Low-attenuation structure within the anterior right lobe of liver measures 8 mm and is too small to characterize. Calcified granuloma is noted within the dome of liver. The visualized portions of the adrenal glands are normal. The visualized  portions of the kidneys and spleen are also normal. Musculoskeletal: There is moderate kyphosis involving the upper thoracic spine. No acute compression fractures identified. IMPRESSION: 1. Aortic atherosclerosis and multi vessel coronary artery calcification. 2. Moderate to large bilateral pleural effusions with associated compressive type atelectasis and consolidation of both lower lobes. 3. Thoracic kyphosis. Electronically Signed   By: Signa Kell M.D.   On: 10/14/2015 15:43  Dg Chest Portable 1 View  10/14/2015  CLINICAL DATA:  Productive cough, mid chest pain, shortness of breath EXAM: PORTABLE CHEST 1 VIEW COMPARISON:  08/07/2013 FINDINGS: Cardiomegaly is noted. Central vascular congestion and mild perihilar interstitial prominence highly suspicious for mild pulmonary edema. There is small left pleural effusion with left lower lobe atelectasis or infiltrate. Atherosclerotic calcifications of thoracic aorta again noted. IMPRESSION: Central mild vascular congestion and mild perihilar interstitial prominence suspicious for mild pulmonary edema. Small left pleural effusion with left lower lobe atelectasis or infiltrate. Electronically Signed   By: Natasha Mead M.D.   On: 10/14/2015 12:10     EKG: (Independently reviewed)  Atrial fibrillation with ventricular rate 128 bpm, QTC 487 ms, nonspecific ST segment changes in inferior lateral leads with isolated Q waves in lead 3 not as prominent and aVF.   Assessment & Plan  Principal Problem:   New onset atrial fibrillation  -Uncertain if atrial fibrillation rapid rate have contributed to patient's respiratory decompensation and led to her pleural effusions or if her pleural effusions precipitated her atrial fibrillation -Admit to SDU/Inpatient -Cardiology consulted -Continue Cardizem infusion at 5 mg per hour and titrate up if indicated by persistent ventricular rates greater than 120 bpm -CHADVASc = 4 so we'll begin IV heparin full dose  anticoagulation but need to discuss risks and benefits given patient's underlying dementia and advanced age and fall risk -Check TSH  Active Problems:   Acute respiratory failure with hypoxia/Bilateral pleural effusion -No leukocytosis or fever and has nonproductive coughing therefore no indication to suspect bacterial pneumonia therefore have discontinued antibiotics -Continue supportive care with oxygen -Potential viral etiology so will check influenza PCR and respiratory viral panel since patient exposed within the past week -IR consulted for ultrasound-guided diagnostic thoracentesis-procedural labs including cytology and culture ordered as well as a.m. LDH to help determine if exudative or transudative -Potentially could be related to heart failure secondary to persistent tachycardia in setting of new diagnosis atrial fibrillation with RVR therefore will check echocardiogram-cardiology consult as above    Hypothyroidism -Continue preadmission Synthroid -Check TSH    Seizure disorder  -Continue preadmission Dilantin    CKD stage 2, GFR 60-89 ml/min -Subtle trace decrease in GFR compared to previous readings -Follow renal function especially after receipt of IV contrast for chest CT    Dementia -Continue preadmission medications -Has a component of dementia related dysphagia and requires medications to be administered in yogurt, pudding or applesauce after crushing; also does best with chopped foods -Has been at least one year since last SLP evaluation according to daughter    Anemia -Check anemia panel    DVT Prophylaxis: Full dose IV heparin  Family Communication:   Daughter at bedside  Code Status:  DO NOT RESUSCITATE  Condition:  Guarded  Discharge disposition: Anticipate discharge back to previous home environment once medically stable-likely would benefit from formal PT/OT evaluation during this hospitalization  Time spent in minutes : 60      Darreon Lutes L.  ANP on 10/14/2015 at 4:54 PM  You may contact me by going to [email or web address] - password [email or web address]  I am available from 7a-7p but please confirm I am on the schedule by going to Amion as above.   After 7p please contact night coverage person covering me after hours  [facility]

## 2015-10-14 NOTE — Consult Note (Signed)
CARDIOLOGY CONSULT NOTE   Patient ID: Tracey Wells MRN: [id number] DOB/AGE: February 03, 1921 82 y.o.  Admit date: 10/14/2015  Primary Physician   Lillia Mountain, MD Primary Cardiologist   New Reason for Consultation   Afib with RVR Requesting Physician  Dr. Fayrene Fearing  HPI: Tracey Wells is a 82 y.o. female with a history of CAD, Stroke, seizure and HTN Who brought by his family for evaluation of progressive symptoms of failure to thrive for the past one week.  Per daughter, patient had a MI approximately 30 years ago and seen by Dr. Katrinka Blazing. Cardiac catheterization at that time was normal no stent placement. Patient's lives with daughter for the past 20 years and never had any cardiac problems since then. For the past 4-6 months, patient functional activity has been declined. Now she has a 24/7 care. For the past week patient has stopped eating and drinking and progressively worsened generalized weakness. Also having some intermittent shortness of breath and chest discomfort. Her chest discomfort only occurs with the cough. She is unable to swallow food. The patient is on a chopped diet and all medicines must be crushed and administered and yogurt. No fever or chills. Patient had a multiple fall in the past 6 months period. Last episode approximately 2 weeks ago when she heat her head to the fridge and slide down. No loss of consciousness. The patient denies palpitation, dizziness, orthopnea, PND, syncope, lower extremity edema, blood in her stool or urine.  In ED, EKG shows A. fib with a ventricular rate of 128 bpm. Chest x-ray shows vascular congestion. CT of the chest showed moderate to large bilateral pleural effusion. Serum creatinine stable. Lactic acid within normal limits. Point of care troponin negative.  The patient was started on IV Cardizem for rate control and IV heparin for anticoagulation. Currently rate is improved.  Past Medical History  Diagnosis Date  . Seizures (HCC)   .  Stroke (HCC)   . Myocardial infarction (HCC)   . Hypothyroidism   . Coronary artery disease   . H/O Bell's palsy   . GERD (gastroesophageal reflux disease)   . Hypertension      Past Surgical History  Procedure Laterality Date  . Cholecystectomy  07/02/2002  . Unilateral oophorectomy      "Benign reasons"  many years ago  . Coronary angioplasty      x 2    No Known Allergies  I have reviewed the patient's current medications . furosemide  40 mg Intravenous Once       Prior to Admission medications   Medication Sig Start Date End Date Taking? Authorizing Provider  amLODipine (NORVASC) 5 MG tablet Take 5 mg by mouth daily.   Yes Historical Provider, MD  Lactobacillus (PROBIOTIC ACIDOPHILUS PO) Take 1 capsule by mouth daily.   Yes Historical Provider, MD  latanoprost (XALATAN) 0.005 % ophthalmic solution Place 1 drop into the left eye at bedtime.   Yes Historical Provider, MD  levothyroxine (SYNTHROID, LEVOTHROID) 125 MCG tablet Take 125 mcg by mouth daily before breakfast.   Yes Historical Provider, MD  LUTEIN-ZEAXANTHIN PO Take 1 capsule by mouth daily.   Yes Historical Provider, MD  Magnesium-Potassium (MAGNESIUM FIZZ-PLUS PO) Take 1 scoop by mouth daily.   Yes Historical Provider, MD  mirtazapine (REMERON) 15 MG tablet Take 15 mg by mouth at bedtime.   Yes Historical Provider, MD  phenytoin (DILANTIN) 100 MG ER capsule Take 300 mg by mouth at bedtime.    Yes Historical  Provider, MD  ranitidine (ZANTAC) 150 MG tablet Take 150 mg by mouth 2 (two) times daily.   Yes Historical Provider, MD  acetaminophen (TYLENOL) 500 MG tablet Take 500 mg by mouth every 6 (six) hours as needed (headache).    Historical Provider, MD  aspirin EC 81 MG tablet Take 81 mg by mouth daily.    Historical Provider, MD  beta carotene w/minerals (OCUVITE) tablet Take 1 tablet by mouth daily.    Historical Provider, MD  cholecalciferol (VITAMIN D) 1000 UNITS tablet Take 1,000 Units by mouth daily.     Historical Provider, MD  levofloxacin (LEVAQUIN) 500 MG tablet Take 1 tablet (500 mg total) by mouth daily. Patient not taking: Reported on 10/14/2015 08/12/13   Benjaman Kindler, MD  levothyroxine (SYNTHROID) 150 MCG tablet Take 150 mcg by mouth daily before breakfast.    Historical Provider, MD  magnesium citrate SOLN Take 2 Bottles by mouth 5 days. Every 5-7 days approx.    Historical Provider, MD  metoprolol (LOPRESSOR) 50 MG tablet Take 25 mg by mouth 2 (two) times daily. Takes 1/2 tab    Historical Provider, MD     Social History   Social History  . Marital Status: Widowed    Spouse Name: N/A  . Number of Children: N/A  . Years of Education: N/A   Occupational History  . Not on file.   Social History Main Topics  . Smoking status: Never Smoker   . Smokeless tobacco: Never Used  . Alcohol Use: No  . Drug Use: No  . Sexual Activity: No   Other Topics Concern  . Not on file   Social History Narrative    Family Status  Relation Status Death Age  . Mother Deceased    Family History  Problem Relation Age of Onset  . Coronary artery disease Mother   . Stroke Brother       ROS:  Full 14 point review of systems complete and found to be negative unless listed above.  Physical Exam: Blood pressure 143/90, pulse 103, temperature 98.6 F (37 C), temperature source Rectal, resp. rate 18, SpO2 94 %.  General: Frail, chronically ill appearing female in no acute distress Head: Eyes PERRLA, No xanthomas. Normocephalic and atraumatic, oropharynx without edema or exudate.  Lungs: Resp regular and unlabored. Diminished breath sound at bases Heart: RI IR  no s3, s4, or murmurs..   Neck: No carotid bruits. No lymphadenopathy. No  JVD. Abdomen: Bowel sounds present, abdomen soft and non-tender without masses or hernias noted. Msk:  No spine or cva tenderness. No weakness, no joint deformities or effusions. Extremities: No clubbing, cyanosis or edema. DP/PT/Radials 2+ and equal  bilaterally. Neuro: Alert and oriented X 3. No focal deficits noted. Psych:  Good affect, responds appropriately Skin: No rashes or lesions noted.  Labs:   Lab Results  Component Value Date   WBC 6.7 10/14/2015   HGB 11.6* 10/14/2015   HCT 35.9* 10/14/2015   MCV 92.1 10/14/2015   PLT 266 10/14/2015   No results for input(s): INR in the last 72 hours.  Recent Labs Lab 10/14/15 1121  NA 144  K 4.2  CL 111  CO2 22  BUN 22*  CREATININE 0.95  CALCIUM 8.8*  GLUCOSE 105*   MAGNESIUM  Date Value Ref Range Status  09/23/2009 2.6* 1.5 - 2.5 mg/dL Final   No results for input(s): CKTOTAL, CKMB, TROPONINI in the last 72 hours.  Recent Labs  10/14/15 1126  TROPIPOC 0.02  Echo: Pending  ECG:   Vent. rate 128 BPM PR interval * ms QRS duration 88 ms QT/QTc 334/487 ms P-R-T axes * 55 -16  Radiology:  Ct Chest W Contrast  10/14/2015  CLINICAL DATA:  Severe shortness of Breath and cough. EXAM: CT CHEST WITH CONTRAST TECHNIQUE: Multidetector CT imaging of the chest was performed during intravenous contrast administration. CONTRAST:  75mL OMNIPAQUE IOHEXOL 300 MG/ML  SOLN COMPARISON:  None FINDINGS: Mediastinum: The heart size is normal. Aortic atherosclerosis is identified. No pericardial effusion. Calcification within the RCA and LAD coronary arteries noted. The trachea appears patent and is midline. Unremarkable appearance of the esophagus. Lungs/Pleura: Moderate to large bilateral pleural effusions identified. There is subsegmental atelectasis within the lingula. Compressive type atelectasis and consolidation is noted within both lower lobes. Upper Abdomen: Low-attenuation structure within the anterior right lobe of liver measures 8 mm and is too small to characterize. Calcified granuloma is noted within the dome of liver. The visualized portions of the adrenal glands are normal. The visualized portions of the kidneys and spleen are also normal. Musculoskeletal: There is moderate  kyphosis involving the upper thoracic spine. No acute compression fractures identified. IMPRESSION: 1. Aortic atherosclerosis and multi vessel coronary artery calcification. 2. Moderate to large bilateral pleural effusions with associated compressive type atelectasis and consolidation of both lower lobes. 3. Thoracic kyphosis. Electronically Signed   By: Signa Kell M.D.   On: 10/14/2015 15:43   Dg Chest Portable 1 View  10/14/2015  CLINICAL DATA:  Productive cough, mid chest pain, shortness of breath EXAM: PORTABLE CHEST 1 VIEW COMPARISON:  08/07/2013 FINDINGS: Cardiomegaly is noted. Central vascular congestion and mild perihilar interstitial prominence highly suspicious for mild pulmonary edema. There is small left pleural effusion with left lower lobe atelectasis or infiltrate. Atherosclerotic calcifications of thoracic aorta again noted. IMPRESSION: Central mild vascular congestion and mild perihilar interstitial prominence suspicious for mild pulmonary edema. Small left pleural effusion with left lower lobe atelectasis or infiltrate. Electronically Signed   By: Natasha Mead M.D.   On: 10/14/2015 12:10    ASSESSMENT AND PLAN:     AVERI KILTY is a 82 y.o. female with a history of CAD, Stroke, seizure and HTN Who brought by his family for evaluation of progressive symptoms of failure to thrive. In the past her symptoms have gotten worse. Now she is unable to drink or eat. Unable to swallow.  1. New onset atrial fibrillation (HCC) -Rate improved on IV Cardizem. CHADSVASCs score of at leads 62 (age, sex, hypertension, stroke). Patient has a coronary calcification on CT of chest. If vascular disease her chair vascular disease 7. Started on IV heparin for anticoagulation. - Patient at risk for bleeding given underlying dementia, advanced and multiple fall. We'll discuss with M.D. However hx of stroke.   2. HX of MI - Had normal cath approximately 30 years ago per daughter. No anginal pain for the  past 20 years. Coronary calcification on CT of chest. - Given age and multiple risk factor patient --> we will not pursue with any ischemic evaluation. Medical management.  3. Multiple fall - Not evaluated by physician. Her symptoms get worse after most recent fall approximately 2-3 weeks ago. - Further evaluation by primary.  4. Bilateral pleural effusion - Pending ultrasound guided thoracocentesis  Otherwise per primary:    Chronic constipation   Hypothyroidism   Seizure disorder (HCC)   Acute respiratory failure with hypoxia (HCC)   Dementia   Anemia   CKD (chronic  kidney disease) stage 2, GFR 60-89 ml/min   Signed: Bhagat,Bhavinkumar, PA 10/14/2015, 5:13 PM Pager [phone]  Co-Sign MD    Patient seen and examined. Agree with assessment and plan.  Ms. Johnika Escareno is a 82 year old debilitated female who lives with her daughter and has become progressively more feeble with failure to thrive.  She has had multiple falls.  She cannot swallow medications her medications need to be crushed and placed in yogurt.  He has a history of hypertension, CVA, and prior seizure disorder.  She presented today with atrial fibrillation with rapid ventricular response at 128 bpm.  A CT of her chest revealed moderate to large bilateral pleural effusion.  Incidentally noted was multilevel vessel coronary calcification We are asked to see her regarding potential anticoagulation.  At present, she is on IV diltiazem at 7.5 mg per hour and her ventricular rate has reduced to 105 bpm.  There is no chest pain.  At present, I long discussion with the patient and her daughter.  With her significant fall history and debilitated state.  Anticoagulation is contraindicated due to significant risk of bleeding secondary to potential falls.  At present, would pursue initiation of 81 mg aspirin.  The patient's heart rate is improved with IV Cardizem.  Since she has difficulty swallowing pills, I will initiate metoprolol  tartrate suspension at 12.5 mg twice a day.  Hopefully, her IV Cardizem can be discontinued tomorrow with oral medication substitution.  I would not pursue further evaluation of her multivessel coronary calcification.  We will see her tomorrow for follow-up evaluation.  Consider palliative care consultation.   Lennette Bihari, MD, [facility] 10/14/2015 6:11 PM

## 2015-10-14 NOTE — ED Notes (Signed)
Attempted report 

## 2015-10-14 NOTE — ED Notes (Signed)
Pt here with family member, reports pt has been in bed since Saturday c/o weakness, cp, sob, confusion, not eating or drinking. Denies cp but has been having sob.

## 2015-10-14 NOTE — Consult Note (Signed)
ANTICOAGULATION CONSULT NOTE - Initial Consult  Pharmacy Consult for heparin Indication: atrial fibrillation  No Known Allergies  Patient Measurements: Height:  (157.5 cm) Weight: 115 lb (52.164 kg) IBW/kg (Calculated) : 50.1 Heparin Dosing Weight: 52.2 kg  Vital Signs: Temp: 98.6 F (37 C) (02/28 1127) Temp Source: Rectal (02/28 1127) BP: 149/86 mmHg (02/28 1700) Pulse Rate: 93 (02/28 1700)  Labs:  Recent Labs  10/14/15 1121  HGB 11.6*  HCT 35.9*  PLT 266  CREATININE 0.95    Estimated Creatinine Clearance: 29.9 mL/min (by C-G formula based on Cr of 0.95).   Assessment: 82 yo female w/ new onset afib. No anticoag PTA. Hgb low, PLT wnl. No bleeding reported. Pt is high risk for bleeding d/t dementia and hx of falls.  Goal of Therapy:  Heparin level 0.3-0.7 units/ml Monitor platelets by anticoagulation protocol: Yes   Plan:  Give 2700 units bolus x 1 Start heparin infusion at 750 units/hr Check anti-Xa level in 8 hours and daily while on heparin Continue to monitor H&H and platelets  Greggory Stallion, PharmD Clinical Pharmacy Resident Pager # [phone] 10/14/2015 5:41 PM

## 2015-10-14 NOTE — ED Notes (Signed)
Pt transported to CT ?

## 2015-10-14 NOTE — ED Provider Notes (Signed)
CSN: [id number]     Arrival date & time 10/14/15  1040 History   First MD Initiated Contact with Patient 10/14/15 1100     Chief Complaint  Patient presents with  . Weakness      HPI  Tracey Wells presents for evaluation of generalized weakness for the last 4 days. Accompanied by her daughter, and a home caregiver.  Tracey Wells is a high functioning 82 year old female. Normally is out of bed in the morning and dresses the showers with the assistance of home health aide. Otherwise is out and around with her daughter. Active and functional. Since Saturday she has been in bed week poor appetite. Occasional cough. No fever no shortness of breath no pain, in particular no chest pain.  Past Medical History  Diagnosis Date  . Seizures (HCC)   . Stroke (HCC)   . Myocardial infarction (HCC)   . Hypothyroidism   . Coronary artery disease   . H/O Bell's palsy   . GERD (gastroesophageal reflux disease)   . Hypertension    Past Surgical History  Procedure Laterality Date  . Cholecystectomy  07/02/2002  . Unilateral oophorectomy      "Benign reasons"  many years ago  . Coronary angioplasty      x 2   Family History  Problem Relation Age of Onset  . Coronary artery disease Mother   . Stroke Brother    Social History  Substance Use Topics  . Smoking status: Never Smoker   . Smokeless tobacco: Never Used  . Alcohol Use: No   OB History    No data available     Review of Systems  Constitutional: Positive for activity change and appetite change. Negative for fever, chills, diaphoresis and fatigue.  HENT: Negative for mouth sores, sore throat and trouble swallowing.   Eyes: Negative for visual disturbance.  Respiratory: Positive for cough. Negative for chest tightness, shortness of breath and wheezing.   Cardiovascular: Negative for chest pain.  Gastrointestinal: Negative for nausea, vomiting, abdominal pain, diarrhea and abdominal distention.  Endocrine: Negative for polydipsia, polyphagia and  polyuria.  Genitourinary: Negative for dysuria, frequency and hematuria.  Musculoskeletal: Negative for gait problem.  Skin: Negative for color change, pallor and rash.  Neurological: Positive for weakness. Negative for dizziness, syncope, light-headedness and headaches.  Hematological: Does not bruise/bleed easily.  Psychiatric/Behavioral: Negative for behavioral problems and confusion.      Allergies  Review of patient's allergies indicates no known allergies.  Home Medications   Prior to Admission medications   Medication Sig Start Date End Date Taking? Authorizing Provider  amLODipine (NORVASC) 5 MG tablet Take 5 mg by mouth daily.   Yes Historical Provider, MD  Lactobacillus (PROBIOTIC ACIDOPHILUS PO) Take 1 capsule by mouth daily.   Yes Historical Provider, MD  latanoprost (XALATAN) 0.005 % ophthalmic solution Place 1 drop into the left eye at bedtime.   Yes Historical Provider, MD  levothyroxine (SYNTHROID, LEVOTHROID) 125 MCG tablet Take 125 mcg by mouth daily before breakfast.   Yes Historical Provider, MD  LUTEIN-ZEAXANTHIN PO Take 1 capsule by mouth daily.   Yes Historical Provider, MD  Magnesium-Potassium (MAGNESIUM FIZZ-PLUS PO) Take 1 scoop by mouth daily.   Yes Historical Provider, MD  mirtazapine (REMERON) 15 MG tablet Take 15 mg by mouth at bedtime.   Yes Historical Provider, MD  phenytoin (DILANTIN) 100 MG ER capsule Take 300 mg by mouth at bedtime.    Yes Historical Provider, MD  ranitidine (ZANTAC) 150 MG tablet Take 150  mg by mouth 2 (two) times daily.   Yes Historical Provider, MD  acetaminophen (TYLENOL) 500 MG tablet Take 500 mg by mouth every 6 (six) hours as needed (headache).    Historical Provider, MD  aspirin EC 81 MG tablet Take 81 mg by mouth daily.    Historical Provider, MD  beta carotene w/minerals (OCUVITE) tablet Take 1 tablet by mouth daily.    Historical Provider, MD  cholecalciferol (VITAMIN D) 1000 UNITS tablet Take 1,000 Units by mouth daily.     Historical Provider, MD  levofloxacin (LEVAQUIN) 500 MG tablet Take 1 tablet (500 mg total) by mouth daily. Patient not taking: Reported on 10/14/2015 08/12/13   Benjaman Kindler, MD  levothyroxine (SYNTHROID) 150 MCG tablet Take 150 mcg by mouth daily before breakfast.    Historical Provider, MD  magnesium citrate SOLN Take 2 Bottles by mouth 5 days. Every 5-7 days approx.    Historical Provider, MD  metoprolol (LOPRESSOR) 50 MG tablet Take 25 mg by mouth 2 (two) times daily. Takes 1/2 tab    Historical Provider, MD   BP 139/107 mmHg  Pulse 111  Temp(Src) 98.6 F (37 C) (Rectal)  Resp 18  SpO2 96% Physical Exam  Constitutional: She is oriented to person, place, and time. She appears well-developed and well-nourished. No distress.  Thin, but not frail appearing 82 year old. Not dyspneic or distressed. Awake and alert. Offers details of her own history.  HENT:  Head: Normocephalic.  Eyes: Conjunctivae are normal. Pupils are equal, round, and reactive to light. No scleral icterus.  Neck: Normal range of motion. Neck supple. No thyromegaly present.  Cardiovascular: An irregularly irregular rhythm present. Tachycardia present.  Exam reveals no gallop and no friction rub.   No murmur heard. Pulmonary/Chest: Effort normal and breath sounds normal. No respiratory distress. She has no wheezes. She has no rales.  Diminished bibasilar breath sounds.  Abdominal: Soft. Bowel sounds are normal. She exhibits no distension. There is no tenderness. There is no rebound.  Musculoskeletal: Normal range of motion.  Neurological: She is alert and oriented to person, place, and time.  Skin: Skin is warm and dry. No rash noted.  Psychiatric: She has a normal mood and affect. Her behavior is normal.    ED Course  Procedures (including critical care time) Labs Review Labs Reviewed  BASIC METABOLIC PANEL - Abnormal; Notable for the following:    Glucose, Bld 105 (*)    BUN 22 (*)    Calcium 8.8 (*)    GFR  calc non Af Amer 50 (*)    GFR calc Af Amer 58 (*)    All other components within normal limits  CBC - Abnormal; Notable for the following:    Hemoglobin 11.6 (*)    HCT 35.9 (*)    All other components within normal limits  URINALYSIS, ROUTINE W REFLEX MICROSCOPIC (NOT AT [facility], [facility], [facility]) - Abnormal; Notable for the following:    APPearance HAZY (*)    Ketones, ur 15 (*)    Protein, ur 30 (*)    Leukocytes, UA TRACE (*)    All other components within normal limits  URINE MICROSCOPIC-ADD ON - Abnormal; Notable for the following:    Squamous Epithelial / LPF 0-5 (*)    Bacteria, UA MANY (*)    All other components within normal limits  CULTURE, BLOOD (ROUTINE X 2)  CULTURE, BLOOD (ROUTINE X 2)  PROCALCITONIN  TSH  I-STAT TROPOININ, ED  I-STAT CG4 LACTIC ACID, ED  Imaging Review Ct Chest W Contrast  10/14/2015  CLINICAL DATA:  Severe shortness of Breath and cough. EXAM: CT CHEST WITH CONTRAST TECHNIQUE: Multidetector CT imaging of the chest was performed during intravenous contrast administration. CONTRAST:  75mL OMNIPAQUE IOHEXOL 300 MG/ML  SOLN COMPARISON:  None FINDINGS: Mediastinum: The heart size is normal. Aortic atherosclerosis is identified. No pericardial effusion. Calcification within the RCA and LAD coronary arteries noted. The trachea appears patent and is midline. Unremarkable appearance of the esophagus. Lungs/Pleura: Moderate to large bilateral pleural effusions identified. There is subsegmental atelectasis within the lingula. Compressive type atelectasis and consolidation is noted within both lower lobes. Upper Abdomen: Low-attenuation structure within the anterior right lobe of liver measures 8 mm and is too small to characterize. Calcified granuloma is noted within the dome of liver. The visualized portions of the adrenal glands are normal. The visualized portions of the kidneys and spleen are also normal. Musculoskeletal: There is moderate kyphosis involving the upper thoracic  spine. No acute compression fractures identified. IMPRESSION: 1. Aortic atherosclerosis and multi vessel coronary artery calcification. 2. Moderate to large bilateral pleural effusions with associated compressive type atelectasis and consolidation of both lower lobes. 3. Thoracic kyphosis. Electronically Signed   By: Signa Kell M.D.   On: 10/14/2015 15:43   Dg Chest Portable 1 View  10/14/2015  CLINICAL DATA:  Productive cough, mid chest pain, shortness of breath EXAM: PORTABLE CHEST 1 VIEW COMPARISON:  08/07/2013 FINDINGS: Cardiomegaly is noted. Central vascular congestion and mild perihilar interstitial prominence highly suspicious for mild pulmonary edema. There is small left pleural effusion with left lower lobe atelectasis or infiltrate. Atherosclerotic calcifications of thoracic aorta again noted. IMPRESSION: Central mild vascular congestion and mild perihilar interstitial prominence suspicious for mild pulmonary edema. Small left pleural effusion with left lower lobe atelectasis or infiltrate. Electronically Signed   By: Natasha Mead M.D.   On: 10/14/2015 12:10   I have personally reviewed and evaluated these images and lab results as part of my medical decision-making.   EKG Interpretation   Date/Time:  Tuesday October 14 2015 10:55:31 EST Ventricular Rate:  128 PR Interval:    QRS Duration: 88 QT Interval:  334 QTC Calculation: 487 R Axis:   55 Text Interpretation:  Atrial fibrillation with rapid ventricular response  Anterior infarct , age undetermined Abnormal ECG Confirmed by Fayrene Fearing  MD,  Gyselle Matthew ([id number]) on 10/14/2015 1:55:24 PM      MDM   Final diagnoses:  Atrial fibrillation with controlled ventricular response (HCC)  Congestive heart failure, unspecified congestive heart failure chronicity, unspecified congestive heart failure type (HCC)  Community acquired pneumonia    Patient in congestive heart failure. Onset A. fib with RVR. Given 10 of Cardizem based on her size and  age. Minimal effect on her heart rate. Drip has been started. We'll repeat 10 mg infusion. Given 20 of IV Lasix. Chest x-ray shows left-sided effusion plus or minus infiltrate. CT scan does not really help differentiate infectious process versus atelectasis from her effusion. Cultures obtained. Has a normal rectal temperature, lactate, and white count. However, considering her imaging was given Rocephin and Zithromax. Discussed the case with the PA on for try hospitalist. Patient admitted to care of Dr. Margot Ables.  CRITICAL CARE Performed by: Rolland Porter JOSEPH   Total critical care time: 35 minutes  Critical care time was exclusive of separately billable procedures and treating other patients.  Critical care was necessary to treat or prevent imminent or life-threatening deterioration.  Critical care was  time spent personally by me on the following activities: development of treatment plan with patient and/or surrogate as well as nursing, discussions with consultants, evaluation of patient's response to treatment, examination of patient, obtaining history from patient or surrogate, ordering and performing treatments and interventions, ordering and review of laboratory studies, ordering and review of radiographic studies, pulse oximetry and re-evaluation of patient's condition. Care     Rolland Porter, MD 10/14/15 1620

## 2015-10-15 ENCOUNTER — Inpatient Hospital Stay (HOSPITAL_COMMUNITY): Payer: Medicare Other

## 2015-10-15 DIAGNOSIS — I509 Heart failure, unspecified: Secondary | ICD-10-CM

## 2015-10-15 DIAGNOSIS — I4891 Unspecified atrial fibrillation: Secondary | ICD-10-CM | POA: Insufficient documentation

## 2015-10-15 LAB — BODY FLUID CELL COUNT WITH DIFFERENTIAL
Eos, Fluid: 0 %
Lymphs, Fluid: 63 %
Monocyte-Macrophage-Serous Fluid: 33 % — ABNORMAL LOW (ref 50–90)
NEUTROPHIL FLUID: 4 % (ref 0–25)
WBC FLUID: 381 uL (ref 0–1000)

## 2015-10-15 LAB — HEPARIN LEVEL (UNFRACTIONATED): Heparin Unfractionated: 0.41 IU/mL (ref 0.30–0.70)

## 2015-10-15 LAB — BASIC METABOLIC PANEL
Anion gap: 14 (ref 5–15)
BUN: 17 mg/dL (ref 6–20)
CHLORIDE: 105 mmol/L (ref 101–111)
CO2: 22 mmol/L (ref 22–32)
Calcium: 8.6 mg/dL — ABNORMAL LOW (ref 8.9–10.3)
Creatinine, Ser: 1.01 mg/dL — ABNORMAL HIGH (ref 0.44–1.00)
GFR, EST AFRICAN AMERICAN: 54 mL/min — AB (ref >60)
GFR, EST NON AFRICAN AMERICAN: 47 mL/min — AB (ref >60)
Glucose, Bld: 78 mg/dL (ref 65–99)
POTASSIUM: 3.8 mmol/L (ref 3.5–5.1)
SODIUM: 141 mmol/L (ref 135–145)

## 2015-10-15 LAB — RETICULOCYTES
RBC.: 2.77 MIL/uL — AB (ref 3.87–5.11)
RETIC COUNT ABSOLUTE: 41.6 10*3/uL (ref 19.0–186.0)
Retic Ct Pct: 1.5 % (ref 0.4–3.1)

## 2015-10-15 LAB — CBC
HEMATOCRIT: 24.6 % — AB (ref 36.0–46.0)
HEMOGLOBIN: 8.2 g/dL — AB (ref 12.0–15.0)
MCH: 29.6 pg (ref 26.0–34.0)
MCHC: 33.3 g/dL (ref 30.0–36.0)
MCV: 88.8 fL (ref 78.0–100.0)
Platelets: 300 10*3/uL (ref 150–400)
RBC: 2.77 MIL/uL — AB (ref 3.87–5.11)
RDW: 14 % (ref 11.5–15.5)
WBC: 7.4 10*3/uL (ref 4.0–10.5)

## 2015-10-15 LAB — VITAMIN B12: VITAMIN B 12: 347 pg/mL (ref 180–914)

## 2015-10-15 LAB — LACTATE DEHYDROGENASE, PLEURAL OR PERITONEAL FLUID: LD FL: 81 U/L — AB (ref 3–23)

## 2015-10-15 LAB — GRAM STAIN

## 2015-10-15 LAB — FOLATE: Folate: 10.7 ng/mL (ref >5.9)

## 2015-10-15 LAB — FERRITIN: FERRITIN: 55 ng/mL (ref 11–307)

## 2015-10-15 LAB — GLUCOSE, SEROUS FLUID: Glucose, Fluid: 89 mg/dL

## 2015-10-15 LAB — IRON AND TIBC
IRON: 26 ug/dL — AB (ref 28–170)
Saturation Ratios: 14 % (ref 10.4–31.8)
TIBC: 183 ug/dL — AB (ref 250–450)
UIBC: 157 ug/dL

## 2015-10-15 LAB — HEMOGLOBIN A1C
Hgb A1c MFr Bld: 5.6 % (ref 4.8–5.6)
MEAN PLASMA GLUCOSE: 114 mg/dL

## 2015-10-15 LAB — PROTEIN, TOTAL: Total Protein: 7.4 g/dL (ref 6.5–8.1)

## 2015-10-15 LAB — PROTEIN, BODY FLUID: Total protein, fluid: <3 g/dL

## 2015-10-15 LAB — LACTATE DEHYDROGENASE: LDH: 200 U/L — AB (ref 98–192)

## 2015-10-15 MED ORDER — METOPROLOL TARTRATE 25 MG/10 ML ORAL SUSPENSION
25.0000 mg | Freq: Two times a day (BID) | ORAL | Status: DC
  Administered 2015-10-15 – 2015-10-17 (×5): 25 mg via ORAL
  Filled 2015-10-15 (×7): qty 10

## 2015-10-15 MED ORDER — ENOXAPARIN SODIUM 40 MG/0.4ML ~~LOC~~ SOLN: 40.0000 mg | SUBCUTANEOUS | Status: DC

## 2015-10-15 MED ORDER — ENOXAPARIN SODIUM 30 MG/0.3ML ~~LOC~~ SOLN
30.0000 mg | SUBCUTANEOUS | Status: DC
  Administered 2015-10-15 – 2015-10-17 (×3): 30 mg via SUBCUTANEOUS
  Filled 2015-10-15 (×2): qty 0.3

## 2015-10-15 MED ORDER — LIDOCAINE HCL (PF) 1 % IJ SOLN
INTRAMUSCULAR | Status: AC
  Filled 2015-10-15: qty 10

## 2015-10-15 MED ORDER — METOPROLOL TARTRATE 50 MG PO TABS: 50.0000 mg | ORAL_TABLET | Freq: Two times a day (BID) | ORAL | Status: DC

## 2015-10-15 MED ORDER — METOPROLOL TARTRATE 50 MG PO TABS
50.0000 mg | ORAL_TABLET | Freq: Three times a day (TID) | ORAL | Status: DC
  Filled 2015-10-15: qty 1

## 2015-10-15 NOTE — Evaluation (Signed)
Physical Therapy Evaluation Patient Details Name: Tracey Wells MRN: [id number] DOB: 1921-02-06 Today's Date: 10/15/2015   History of Present Illness  82 year old female patient with past medical history of progressive dementia with associated dysphagia, remote history CAD, hypothyroidism, seizure disorder, chronic constipation, hypertension and stroke who presents to the hospital with progressive failure to thrive symptoms for at least one week. Patient has slowly stopped eating and drinking for the past 1 week and has been complaining of generalized weakness, shortness of breath at rest as well as significant dyspnea on exertion. She also had nonproductive paroxysmal coughing episodes with pleuritic chest pain as well as some mild substernal chest pain.   Clinical Impression  Pt admitted with above diagnosis. Pt currently with functional limitations due to the deficits listed below (see PT Problem List). Pt able to ambulate in hallway but needs verbal and tactile cues for correct use of walker. Pt is very high fall risk due to cognitive status and pt's daughter says that she will get up on her own and spontaneously try to sit when she is walking. Pt will benefit from skilled PT to increase their independence and safety with mobility to allow discharge to the venue listed below.     Follow Up Recommendations SNF;Supervision/Assistance - 24 hour;Other (comment) (Daughter requesting Palliative care consult. )    Equipment Recommendations  None recommended by PT    Recommendations for Other Services Other (comment) (Palliative Care Consult )     Precautions / Restrictions Precautions Precautions: Fall Restrictions Weight Bearing Restrictions: No      Mobility  Bed Mobility Overal bed mobility: Needs Assistance Bed Mobility: Supine to Sit     Supine to sit: Min assist;HOB elevated     General bed mobility comments: Pt needed min A to scoot to the EOB.   Transfers Overall transfer  level: Needs assistance Equipment used: Rolling walker (2 wheeled) Transfers: Sit to/from Stand Sit to Stand: Min assist;From elevated surface            Ambulation/Gait Ambulation/Gait assistance: Min assist;+2 safety/equipment Ambulation Distance (Feet): 50 Feet (10+40) Assistive device: Rolling walker (2 wheeled) Gait Pattern/deviations: Step-to pattern;Decreased stride length;Shuffle;Narrow base of support;Trunk flexed Gait velocity: decreased Gait velocity interpretation: Below normal speed for age/gender General Gait Details: Needs chair follow as daughter says that pt may spontaneously sit. Pt needs verbal cues to stay close to walker and tactile cues to stand upright.   Stairs            Wheelchair Mobility    Modified Rankin (Stroke Patients Only)       Balance Overall balance assessment: Needs assistance Sitting-balance support: Bilateral upper extremity supported;Feet supported Sitting balance-Leahy Scale: Fair     Standing balance support: Bilateral upper extremity supported Standing balance-Leahy Scale: Poor Standing balance comment: Reliant on UE support with RW.                              Pertinent Vitals/Pain Pain Assessment: No/denies pain  Patient Saturations on Room Air at Rest = 92%  Patient Saturations on Room Air while Ambulating = 88%  Patient Saturations on 4 Liters of oxygen while Ambulating = 97%      Home Living Family/patient expects to be discharged to:: Private residence Living Arrangements: Children Available Help at Discharge: Available 24 hours/day;Personal care attendant;Family Type of Home: House Home Access: Stairs to enter Entrance Stairs-Rails: Left Entrance Stairs-Number of Steps: 3 Home Layout: One  level Home Equipment: Walker - 2 wheels;Shower seat;Toilet riser;Grab bars - tub/shower;Grab bars - toilet;Hand held shower head Additional Comments: Spoke with daughter whom is experiencing heavy  caregiver burden and also states that she is having backsurgery soon and does not have anyone to care for her mother. The daughter states that she has are in home caregiver 5 hours per day to give her a break because her mother can absolutely not be left alone because she will get up on her own and fall. The daughter would like to discuss long term placement with someone. She does not think that rehab will help her because her dementia is progressing and she has noticed a progressive decline in her mother in the last 6 months.     Prior Function Level of Independence: Needs assistance   Gait / Transfers Assistance Needed: walks with RW and MinGuard due to fall risk   ADL's / Homemaking Assistance Needed: Needs assistance        Hand Dominance   Dominant Hand: Right    Extremity/Trunk Assessment   Upper Extremity Assessment: Generalized weakness           Lower Extremity Assessment: Generalized weakness      Cervical / Trunk Assessment: Kyphotic  Communication   Communication: No difficulties  Cognition Arousal/Alertness: Awake/alert Behavior During Therapy: WFL for tasks assessed/performed Overall Cognitive Status: History of cognitive impairments - at baseline       Memory: Decreased recall of precautions;Decreased short-term memory              General Comments General comments (skin integrity, edema, etc.): Pt has progressive dementia, adequate history provided by daughter who is her caregiver. Pt is very high fall risk so be sure to place chair alarm in chair.     Exercises        Assessment/Plan    PT Assessment Patient needs continued PT services  PT Diagnosis Difficulty walking;Abnormality of gait;Generalized weakness   PT Problem List Decreased strength;Decreased activity tolerance;Decreased balance;Decreased mobility;Decreased cognition;Decreased knowledge of use of DME;Decreased safety awareness;Cardiopulmonary status limiting activity  PT Treatment  Interventions Gait training;Functional mobility training;Therapeutic activities;Therapeutic exercise;Balance training;Patient/family education   PT Goals (Current goals can be found in the Care Plan section) Acute Rehab PT Goals Patient Stated Goal: To go home.  PT Goal Formulation: With patient/family Time For Goal Achievement: 10/29/15 Potential to Achieve Goals: Fair    Frequency Min 2X/week   Barriers to discharge Decreased caregiver support Pt's daughter is having back surgery and pt needs 24 hour supervision/assistance.     Co-evaluation               End of Session Equipment Utilized During Treatment: Gait belt;Oxygen Activity Tolerance: Patient tolerated treatment well Patient left: in chair;with call bell/phone within reach;with chair alarm set;with family/visitor present Nurse Communication: Mobility status         Time: 9147-8295 PT Time Calculation (min) (ACUTE ONLY): 39 min   Charges:   PT Evaluation $PT Eval Moderate Complexity: 1 Procedure PT Treatments $Gait Training: 8-22 mins $Self Care/Home Management: 8-22   PT G Codes:        Everlean Cherry, SPT Everlean Cherry 10/15/2015, 1:42 PM

## 2015-10-15 NOTE — Progress Notes (Signed)
Echocardiogram 2D Echocardiogram has been performed.  Tracey Wells 10/15/2015, 1:14 PM

## 2015-10-15 NOTE — Procedures (Signed)
Successful US guided right thoracentesis. Yielded 750 mls of clear yellow fluid. Pt tolerated procedure well. No immediate complications.  Specimen was sent for labs. CXR ordered.  Waverly Chavarria S Aanika Defoor PA-C 10/15/2015 1:12 PM

## 2015-10-15 NOTE — Progress Notes (Signed)
SATURATION QUALIFICATIONS: (This note is used to comply with regulatory documentation for home oxygen)  Patient Saturations on Room Air at Rest = 92%  Patient Saturations on Room Air while Ambulating = 88%  Patient Saturations on 4 Liters of oxygen while Ambulating = 97%  Please briefly explain why patient needs home oxygen: Will continue to assess.  -Everlean Cherry, SPT

## 2015-10-15 NOTE — Progress Notes (Signed)
ANTICOAGULATION CONSULT NOTE - Follow Up Consult  Pharmacy Consult for Heparin  Indication: atrial fibrillation  No Known Allergies  Patient Measurements: Height:  (157.5 cm) Weight: 115 lb (52.164 kg) IBW/kg (Calculated) : 50.1  Vital Signs: Temp: 97.5 F (36.4 C) (03/01 0030) Temp Source: Oral (03/01 0030) BP: 107/79 mmHg (03/01 0100) Pulse Rate: 92 (03/01 0100)  Labs:  Recent Labs  10/14/15 1121 10/15/15 0231  HGB 11.6*  --   HCT 35.9*  --   PLT 266  --   HEPARINUNFRC  --  0.41  CREATININE 0.95  --     Estimated Creatinine Clearance: 29.9 mL/min (by C-G formula based on Cr of 0.95).  Assessment: Initial heparin level is therapeutic   Goal of Therapy:  Heparin level 0.3-0.7 units/ml Monitor platelets by anticoagulation protocol: Yes   Plan:  -Cont heparin at 750 units/hr -1000 HL  Abran Duke 10/15/2015,3:24 AM

## 2015-10-15 NOTE — Progress Notes (Signed)
PATIENT DETAILS Name: Tracey Wells Age: 82 y.o. Sex: female Date of Birth: 12-13-1920 Admit Date: 10/14/2015 Admitting Physician Ozella Rocks, MD ZOX:[id number] Jomarie Longs, MD  Subjective: Feels better today. No shortness of breath.  Assessment/Plan: Atrial fibrillation with RVR: Rate better, attempt to titrate off Cardizem infusion, start oral beta blockers. CHADSVASCs score of at leads 6-unfortunately given advanced age, fall risk-not a candidate for anticoagulation. Discontinue IV heparin, start aspirin. await echocardiogram.   Acute hypoxic respiratory failure: Suspect secondary to pleural effusion and A. fib RVR. Titrate off oxygen. Follow.   Bilateral pleural effusion: Scheduled for thoracocentesis today, await to see if it is a transudate (from CHF) or a exudate (parapneumonic). Follow.  Hypothyroidism: Continue Synthroid-TSH within normal limits.  Seizure disorder: Continue Dilantin.  Hypertension: Currently controlled, continue metoprolol. Resume amlodipine when able.  GERD: Continue Zantac.  History of dementia: Stable-very minimally confused at best-follows all my commands. Continue Remeron.  Dysphagia: Await speech therapy evaluation.  Deconditioning: Await PT evaluation.  Palliative Care/Failure to thrive syndrome: Unfortunate 82 year old female with history of dementia-per D she has had significant decline in her functional status over the past few months. She is totally dependent on her daughter for all daily activities of living. She has had worsening in her dysphagia-per daughter by mouth intake is minimal to none for the past few weeks. She is already established DO NOT RESUSCITATE. Family does not desire any heroic measures for aggressive care-they're okay with a diagnostic/therapeutic thoracocentesis at this time.  We will see how she does with physical therapy and speech evaluation-but if she continues to exhibit very poor oral intake, I  suspect she will qualify for residential hospice-if not, she will likely need SNF with hospice/palliative care follow-up.  Disposition: Remain inpatient-once off Cardizem gtt-transfer to MedSurg unit.   Antimicrobial agents  See below  Anti-infectives    Start     Dose/Rate Route Frequency Ordered Stop   10/14/15 1615  azithromycin (ZITHROMAX) 500 mg in dextrose 5 % 250 mL IVPB  Status:  Discontinued     500 mg 250 mL/hr over 60 Minutes Intravenous  Once 10/14/15 1600 10/14/15 1650   10/14/15 1615  cefTRIAXone (ROCEPHIN) 1 g in dextrose 5 % 50 mL IVPB     1 g 100 mL/hr over 30 Minutes Intravenous  Once 10/14/15 1600 10/14/15 1716      DVT Prophylaxis: Prophylactic Lovenox   Code Status: DNR  Family Communication Daughter over the phone  Procedures: None  CONSULTS:  cardiology  Time spent 35 minutes-Greater than 50% of this time was spent in counseling, explanation of diagnosis, planning of further management, and coordination of care.  MEDICATIONS: Scheduled Meds: . aspirin EC  81 mg Oral Daily  . beta carotene w/minerals  1 tablet Oral Daily  . cholecalciferol  1,000 Units Oral Daily  . famotidine  20 mg Oral Daily  . latanoprost  1 drop Left Eye QHS  . levothyroxine  125 mcg Oral QAC breakfast  . metoprolol tartrate  25 mg Oral BID  . mirtazapine  15 mg Oral QHS  . phenytoin  300 mg Oral QHS   Continuous Infusions: . diltiazem (CARDIZEM) infusion 5 mg/hr (10/15/15 0530)   PRN Meds:.acetaminophen, acetaminophen, ondansetron (ZOFRAN) IV    PHYSICAL EXAM: Vital signs in last 24 hours: Filed Vitals:   10/15/15 0400 10/15/15 0500 10/15/15 0600 10/15/15 0923  BP: 100/66  115/71 125/82  Pulse:  102   106  Temp: 97.7 F (36.5 C)   98.5 F (36.9 C)  TempSrc: Oral   Oral  Resp: Height:      Weight:  52.39 kg (115 lb 8 oz)    SpO2: 94%   97%    Weight change:  Filed Weights   10/14/15 1700 10/15/15 0500  Weight: 52.164 kg (115 lb) 52.39 kg  (115 lb 8 oz)   Body mass index is 21.12 kg/(m^2).   Gen Exam: Awake and alert with clear speech.  Neck: Supple, No JVD.   Chest: B/L Clear.   CVS: S1 S2 irregular Abdomen: soft, BS +, non tender, non distended.  Extremities: no edema, lower extremities warm to touch. Neurologic: Non Focal.   Skin: No Rash.   Wounds: N/A.   Intake/Output from previous day:  Intake/Output Summary (Last 24 hours) at 10/15/15 1140 Last data filed at 10/15/15 0900  Gross per 24 hour  Intake  332.5 ml  Output    100 ml  Net  232.5 ml     LAB RESULTS: CBC  Recent Labs Lab 10/14/15 1121 10/15/15 0428  WBC 6.7 7.4  HGB 11.6* 8.2*  HCT 35.9* 24.6*  PLT 266 300  MCV 92.1 88.8  MCH 29.7 29.6  MCHC 32.3 33.3  RDW 14.2 14.0    Chemistries   Recent Labs Lab 10/14/15 1121 10/15/15 0428  NA 144 141  K 4.2 3.8  CL 111 105  CO2 22 22  GLUCOSE 105* 78  BUN 22* 17  CREATININE 0.95 1.01*  CALCIUM 8.8* 8.6*    CBG: No results for input(s): GLUCAP in the last 168 hours.  GFR Estimated Creatinine Clearance: 28.1 mL/min (by C-G formula based on Cr of 1.01).  Coagulation profile No results for input(s): INR, PROTIME in the last 168 hours.  Cardiac Enzymes No results for input(s): CKMB, TROPONINI, MYOGLOBIN in the last 168 hours.  Invalid input(s): CK  Invalid input(s): POCBNP No results for input(s): DDIMER in the last 72 hours.  Recent Labs  10/14/15 1850  HGBA1C 5.6   No results for input(s): CHOL, HDL, LDLCALC, TRIG, CHOLHDL, LDLDIRECT in the last 72 hours.  Recent Labs  10/14/15 1640  TSH 3.013    Recent Labs  10/15/15 0428  VITAMINB12 347  FOLATE 10.7  FERRITIN 55  TIBC 183*  IRON 26*  RETICCTPCT 1.5   No results for input(s): LIPASE, AMYLASE in the last 72 hours.  Urine Studies No results for input(s): UHGB, CRYS in the last 72 hours.  Invalid input(s): UACOL, UAPR, USPG, UPH, UTP, UGL, UKET, UBIL, UNIT, UROB, ULEU, UEPI, UWBC, URBC, UBAC, CAST, UCOM,  BILUA  MICROBIOLOGY: Recent Results (from the past 240 hour(s))  MRSA PCR Screening     Status: None   Collection Time: 10/14/15  6:14 PM  Result Value Ref Range Status   MRSA by PCR NEGATIVE NEGATIVE Final    Comment:        The GeneXpert MRSA Assay (FDA approved for NASAL specimens only), is one component of a comprehensive MRSA colonization surveillance program. It is not intended to diagnose MRSA infection nor to guide or monitor treatment for MRSA infections.     RADIOLOGY STUDIES/RESULTS: Ct Chest W Contrast  10/14/2015  CLINICAL DATA:  Severe shortness of Breath and cough. EXAM: CT CHEST WITH CONTRAST TECHNIQUE: Multidetector CT imaging of the chest was performed during intravenous contrast administration. CONTRAST:  75mL OMNIPAQUE IOHEXOL 300 MG/ML  SOLN  COMPARISON:  None FINDINGS: Mediastinum: The heart size is normal. Aortic atherosclerosis is identified. No pericardial effusion. Calcification within the RCA and LAD coronary arteries noted. The trachea appears patent and is midline. Unremarkable appearance of the esophagus. Lungs/Pleura: Moderate to large bilateral pleural effusions identified. There is subsegmental atelectasis within the lingula. Compressive type atelectasis and consolidation is noted within both lower lobes. Upper Abdomen: Low-attenuation structure within the anterior right lobe of liver measures 8 mm and is too small to characterize. Calcified granuloma is noted within the dome of liver. The visualized portions of the adrenal glands are normal. The visualized portions of the kidneys and spleen are also normal. Musculoskeletal: There is moderate kyphosis involving the upper thoracic spine. No acute compression fractures identified. IMPRESSION: 1. Aortic atherosclerosis and multi vessel coronary artery calcification. 2. Moderate to large bilateral pleural effusions with associated compressive type atelectasis and consolidation of both lower lobes. 3. Thoracic  kyphosis. Electronically Signed   By: Signa Kell M.D.   On: 10/14/2015 15:43   Dg Chest Portable 1 View  10/14/2015  CLINICAL DATA:  Productive cough, mid chest pain, shortness of breath EXAM: PORTABLE CHEST 1 VIEW COMPARISON:  08/07/2013 FINDINGS: Cardiomegaly is noted. Central vascular congestion and mild perihilar interstitial prominence highly suspicious for mild pulmonary edema. There is small left pleural effusion with left lower lobe atelectasis or infiltrate. Atherosclerotic calcifications of thoracic aorta again noted. IMPRESSION: Central mild vascular congestion and mild perihilar interstitial prominence suspicious for mild pulmonary edema. Small left pleural effusion with left lower lobe atelectasis or infiltrate. Electronically Signed   By: Natasha Mead M.D.   On: 10/14/2015 12:10    Jeoffrey Massed, MD  [facility] Pager:[phone]  If 7PM-7AM, please contact night-coverage [email or web address] Password [email or web address] 10/15/2015, 11:40 AM   LOS: 1 day

## 2015-10-15 NOTE — Plan of Care (Signed)
Problem: Acute Rehab PT Goals(only PT should resolve) Goal: Pt Will Transfer Bed To Chair/Chair To Bed Min Guard Assist

## 2015-10-15 NOTE — Progress Notes (Addendum)
Subjective:  Feels better; asking about going home  Objective:   Vital Signs : Filed Vitals:   10/15/15 0400 10/15/15 0500 10/15/15 0600 10/15/15 0923  BP: 100/66  115/71 125/82  Pulse: 102   106  Temp: 97.7 F (36.5 C)   98.5 F (36.9 C)  TempSrc: Oral   Oral  Resp: _0 Height:      Weight:  115 lb 8 oz (52.39 kg)    SpO2: 94%   97%    Intake/Output from previous day:  Intake/Output Summary (Last 24 hours) at 10/15/15 0947 Last data filed at 10/15/15 0700  Gross per 24 hour  Intake   92.5 ml  Output    100 ml  Net   -7.5 ml    I/O since admission: -7.5  Wt Readings from Last 3 Encounters:  10/15/15 115 lb 8 oz (52.39 kg)  08/07/13 117 lb (53.071 kg)    Medications: . aspirin EC  81 mg Oral Daily  . beta carotene w/minerals  1 tablet Oral Daily  . cholecalciferol  1,000 Units Oral Daily  . famotidine  20 mg Oral Daily  . latanoprost  1 drop Left Eye QHS  . levothyroxine  125 mcg Oral QAC breakfast  . metoprolol  50 mg Oral TID  . mirtazapine  15 mg Oral QHS  . phenytoin  300 mg Oral QHS    . diltiazem (CARDIZEM) infusion 5 mg/hr (10/15/15 0530)    Physical Exam:   General appearance: alert and no distress Neck: no adenopathy, no carotid bruit, no JVD, supple, symmetrical, trachea midline and thyroid not enlarged, symmetric, no tenderness/mass/nodules Back: kyphotic Lungs: no wheezing Heart: irregularly irregular rhythm 1/6 systolic murmur Abdomen: soft, non-tender; bowel sounds normal; no masses,  no organomegaly Extremities: no edema, redness or tenderness in the calves or thighs   Rate: 110  Rhythm: atrial fibrillation  ECG (independently read by me):  Lab Results:   Recent Labs  10/14/15 1121 10/15/15 0428  NA 144 141  K 4.2 3.8  CL 111 105  CO2 22 22  GLUCOSE 105* 78  BUN 22* 17  CREATININE 0.95 1.01*  CALCIUM 8.8* 8.6*    Hepatic Function Latest Ref Rng 08/08/2013 09/23/2009 09/22/2009  Total Protein 6.0 - 8.3 g/dL  5.2(L) 6.4 7.5  Albumin 3.5 - 5.2 g/dL 1.8(L) 3.0(L) 3.8  AST 0 - 37 U/L 10 22 33  ALT 0 - 35 U/L _1 Alk Phosphatase 39 - 117 U/L 116 103 129(H)  Total Bilirubin 0.3 - 1.2 mg/dL 0.4 0.5 0.3     Recent Labs  10/14/15 1121 10/15/15 0428  WBC 6.7 7.4  HGB 11.6* 8.2*  HCT 35.9* 24.6*  MCV 92.1 88.8  PLT 266 300    No results for input(s): TROPONINI in the last 72 hours.  Invalid input(s): CK, MB  Lab Results  Component Value Date   TSH 3.013 10/14/2015    Recent Labs  10/14/15 1850  HGBA1C 5.6    No results for input(s): PROT, ALBUMIN, AST, ALT, ALKPHOS, BILITOT, BILIDIR, IBILI in the last 72 hours. No results for input(s): INR in the last 72 hours. BNP (last 3 results)  Recent Labs  10/14/15 1850  BNP 943.3*    ProBNP (last 3 results) No results for input(s): PROBNP in the last 8760 hours.   Lipid Panel  No results found for: CHOL, TRIG, HDL, CHOLHDL, VLDL, LDLCALC, LDLDIRECT    Imaging:  Ct Chest  W Contrast  10/14/2015  CLINICAL DATA:  Severe shortness of Breath and cough. EXAM: CT CHEST WITH CONTRAST TECHNIQUE: Multidetector CT imaging of the chest was performed during intravenous contrast administration. CONTRAST:  38m OMNIPAQUE IOHEXOL 300 MG/ML  SOLN COMPARISON:  None FINDINGS: Mediastinum: The heart size is normal. Aortic atherosclerosis is identified. No pericardial effusion. Calcification within the RCA and LAD coronary arteries noted. The trachea appears patent and is midline. Unremarkable appearance of the esophagus. Lungs/Pleura: Moderate to large bilateral pleural effusions identified. There is subsegmental atelectasis within the lingula. Compressive type atelectasis and consolidation is noted within both lower lobes. Upper Abdomen: Low-attenuation structure within the anterior right lobe of liver measures 8 mm and is too small to characterize. Calcified granuloma is noted within the dome of liver. The visualized portions of the adrenal glands  are normal. The visualized portions of the kidneys and spleen are also normal. Musculoskeletal: There is moderate kyphosis involving the upper thoracic spine. No acute compression fractures identified. IMPRESSION: 1. Aortic atherosclerosis and multi vessel coronary artery calcification. 2. Moderate to large bilateral pleural effusions with associated compressive type atelectasis and consolidation of both lower lobes. 3. Thoracic kyphosis. Electronically Signed   By: TKerby MoorsM.D.   On: 10/14/2015 15:43   Dg Chest Portable 1 View  10/14/2015  CLINICAL DATA:  Productive cough, mid chest pain, shortness of breath EXAM: PORTABLE CHEST 1 VIEW COMPARISON:  08/07/2013 FINDINGS: Cardiomegaly is noted. Central vascular congestion and mild perihilar interstitial prominence highly suspicious for mild pulmonary edema. There is small left pleural effusion with left lower lobe atelectasis or infiltrate. Atherosclerotic calcifications of thoracic aorta again noted. IMPRESSION: Central mild vascular congestion and mild perihilar interstitial prominence suspicious for mild pulmonary edema. Small left pleural effusion with left lower lobe atelectasis or infiltrate. Electronically Signed   By: LLahoma CrockerM.D.   On: 10/14/2015 12:10      Assessment/Plan:   Principal Problem:   New onset atrial fibrillation (HCC) Active Problems:   Chronic constipation   Hypothyroidism   Seizure disorder (HCC)   Acute respiratory failure with hypoxia (HCC)   Bilateral pleural effusion   Dementia   Anemia   CKD (chronic kidney disease) stage 2, GFR 60-89 ml/min    AF with RVR now on cardizem drip at 5 mg/hr with HR improved but ~105 now. .  According to daughter pt cannot swallow pills and her pills at home had to be crushed and taken with yogert. ? If she was taking 25 mg bid of lopressor PTA regularly.  Review of med list for this am reveals 50 mg tid dosing of metoprolol.  She is very frail and is only 52 kilos which may  be too much. Recommend dc that dosing and re-adjust initially to 25 mg q 8 hrs with suspension or try 50 mg bid dosing, but initially verify if she was taking the reported home dose. If she was not then start at a lower dose.   Not a candidate for anticoagulation.  BNP elevated for CHF at least probably diastolic contributed by AF with RVR. No work-up for CAD.  Will dc metoprolol 50 mg tid tablet dose and start suspension 25 mg bid until verified home meds.    TTroy Sine MD, FBhc [facility]3/08/2015, 9:47 AM

## 2015-10-15 NOTE — Evaluation (Signed)
Clinical/Bedside Swallow Evaluation Patient Details  Name: Tracey Wells MRN: [id number] Date of Birth: 06-16-1921  Today's Date: 10/15/2015 Time: SLP Start Time (ACUTE ONLY): 1525 SLP Stop Time (ACUTE ONLY): 1539 SLP Time Calculation (min) (ACUTE ONLY): 14 min  Past Medical History:  Past Medical History  Diagnosis Date  . Stroke (HCC)   . Hypothyroidism   . Coronary artery disease   . H/O Bell's palsy 2000s  . GERD (gastroesophageal reflux disease)   . Hypertension   . Family history of adverse reaction to anesthesia     "daughter gets violently ill"  . Myocardial infarction [facility]) 1980s?  [facility] Pneumonia 2014  . Seizures (HCC)     "takes RX daily; hasn't had one in 20-30 yrs; serious issue when she was younger" (10/14/2015)  . Headache     "at least weekly" (10/14/2015)  . Arthritis     "hands, legs, arms" (10/14/2015)  . Atrial fibrillation with RVR (HCC) 10/14/2015    new onset 10/14/2015  . Falls frequently    Past Surgical History:  Past Surgical History  Procedure Laterality Date  . Cholecystectomy  07/02/2002  . Oophorectomy      "Benign reasons"  many years ago  . Abdominal hysterectomy    . Coronary angioplasty  X 2  . Cataract extraction w/ intraocular lens  implant, bilateral Bilateral    HPI:  82 year old female patient with past medical history of progressive dementia with associated dysphagia, remote history CAD, hypothyroidism, seizure disorder, chronic constipation, hypertension, Bell's palsy, and stroke who presents to the hospital with progressive failure to thrive symptoms for at least one week. Patient has slowly stopped eating and drinking for the past 1 week and has been complaining of generalized weakness, shortness of breath at rest as well as significant dyspnea on exertion. She also had nonproductive paroxysmal coughing episodes with pleuritic chest pain as well as some mild substernal chest pain. Previous MBS in 01/2015 recommended Dys 3 diet and thin  liquids with no straws due to silent penetration with thin liquids by straw.   Assessment / Plan / Recommendation Clinical Impression  Pt requires moderate encouragement for PO intake, but shows no overt s/s of aspiration during consumption. Mastication is prolonged with soft solids, and MBS in June 2016 revealed silent penetration of thin liquids when using a straw. Recommend Dys 2 diet and thin liquids by cup only. SLP to follow for tolerance.    Aspiration Risk  Mild aspiration risk    Diet Recommendation Dysphagia 2 (Fine chop);Thin liquid   Liquid Administration via: Cup;No straw Medication Administration: Whole meds with puree Supervision: Patient able to self feed;Full supervision/cueing for compensatory strategies Compensations: Minimize environmental distractions;Slow rate;Small sips/bites;Clear throat intermittently Postural Changes: Seated upright at 90 degrees    Other  Recommendations Oral Care Recommendations: Oral care BID   Follow up Recommendations   (tba)    Frequency and Duration min 2x/week  2 weeks       Prognosis Prognosis for Safe Diet Advancement: Fair Barriers to Reach Goals: Cognitive deficits      Swallow Study   General HPI: 82 year old female patient with past medical history of progressive dementia with associated dysphagia, remote history CAD, hypothyroidism, seizure disorder, chronic constipation, hypertension, Bell's palsy, and stroke who presents to the hospital with progressive failure to thrive symptoms for at least one week. Patient has slowly stopped eating and drinking for the past 1 week and has been complaining of generalized weakness, shortness of breath at rest as  well as significant dyspnea on exertion. She also had nonproductive paroxysmal coughing episodes with pleuritic chest pain as well as some mild substernal chest pain. Previous MBS in 01/2015 recommended Dys 3 diet and thin liquids with no straws due to silent penetration with thin  liquids by straw. Type of Study: Bedside Swallow Evaluation Previous Swallow Assessment: BSE in 2014 recommending Dys 3/thin liquids, MBS in 01/2015 recommending Dys 3 and thin liquids but no straws Diet Prior to this Study: Regular;Thin liquids Temperature Spikes Noted: No Respiratory Status: Nasal cannula History of Recent Intubation: No Behavior/Cognition: Alert;Cooperative;Pleasant mood;Requires cueing Oral Cavity Assessment: Within Functional Limits Oral Care Completed by SLP: No Oral Cavity - Dentition: Missing dentition Vision: Functional for self-feeding Self-Feeding Abilities: Able to feed self Patient Positioning: Upright in bed Baseline Vocal Quality: Normal    Oral/Motor/Sensory Function Overall Oral Motor/Sensory Function: Within functional limits   Ice Chips Ice chips: Not tested   Thin Liquid Thin Liquid: Impaired Presentation: Self Fed;Straw Pharyngeal  Phase Impairments: Suspected delayed Swallow    Nectar Thick Nectar Thick Liquid: Not tested   Honey Thick Honey Thick Liquid: Not tested   Puree Puree: Within functional limits Presentation: Self Fed;Spoon   Solid   GO   Solid: Impaired Presentation: Self Fed Oral Phase Impairments: Impaired mastication        Maxcine Ham, M.A. CCC-SLP [phone]  Maxcine Ham 10/15/2015,3:59 PM

## 2015-10-16 DIAGNOSIS — Z515 Encounter for palliative care: Secondary | ICD-10-CM | POA: Insufficient documentation

## 2015-10-16 DIAGNOSIS — G40909 Epilepsy, unspecified, not intractable, without status epilepticus: Secondary | ICD-10-CM

## 2015-10-16 DIAGNOSIS — Z7189 Other specified counseling: Secondary | ICD-10-CM | POA: Insufficient documentation

## 2015-10-16 DIAGNOSIS — I5031 Acute diastolic (congestive) heart failure: Secondary | ICD-10-CM

## 2015-10-16 LAB — BASIC METABOLIC PANEL
ANION GAP: 10 (ref 5–15)
BUN: 20 mg/dL (ref 6–20)
CALCIUM: 8.5 mg/dL — AB (ref 8.9–10.3)
CO2: 20 mmol/L — AB (ref 22–32)
CREATININE: 1.04 mg/dL — AB (ref 0.44–1.00)
Chloride: 109 mmol/L (ref 101–111)
GFR, EST AFRICAN AMERICAN: 52 mL/min — AB (ref >60)
GFR, EST NON AFRICAN AMERICAN: 45 mL/min — AB (ref >60)
Glucose, Bld: 87 mg/dL (ref 65–99)
Potassium: 3.7 mmol/L (ref 3.5–5.1)
SODIUM: 139 mmol/L (ref 135–145)

## 2015-10-16 LAB — RESPIRATORY VIRUS PANEL
Adenovirus: NEGATIVE
INFLUENZA A: NEGATIVE
INFLUENZA B 1: NEGATIVE
Metapneumovirus: NEGATIVE
PARAINFLUENZA 3 A: NEGATIVE
Parainfluenza 1: NEGATIVE
Parainfluenza 2: NEGATIVE
RESPIRATORY SYNCYTIAL VIRUS A: NEGATIVE
Respiratory Syncytial Virus B: NEGATIVE
Rhinovirus: NEGATIVE

## 2015-10-16 LAB — TRIGLYCERIDES, BODY FLUIDS: Triglycerides, Fluid: 15 mg/dL

## 2015-10-16 LAB — URINE CULTURE: Culture: >=100000

## 2015-10-16 LAB — CBC
HCT: 34.3 % — ABNORMAL LOW (ref 36.0–46.0)
Hemoglobin: 11.1 g/dL — ABNORMAL LOW (ref 12.0–15.0)
MCH: 29.3 pg (ref 26.0–34.0)
MCHC: 32.4 g/dL (ref 30.0–36.0)
MCV: 90.5 fL (ref 78.0–100.0)
PLATELETS: 236 10*3/uL (ref 150–400)
RBC: 3.79 MIL/uL — ABNORMAL LOW (ref 3.87–5.11)
RDW: 13.8 % (ref 11.5–15.5)
WBC: 6.5 10*3/uL (ref 4.0–10.5)

## 2015-10-16 MED ORDER — LORAZEPAM 1 MG PO TABS: 1.0000 mg | ORAL_TABLET | Freq: Four times a day (QID) | ORAL | Status: DC | PRN

## 2015-10-16 MED ORDER — METOPROLOL TARTRATE 1 MG/ML IV SOLN: 2.5000 mg | INTRAVENOUS | Status: DC | PRN

## 2015-10-16 MED ORDER — PROSIGHT PO TABS
1.0000 | ORAL_TABLET | Freq: Every day | ORAL | Status: DC
  Administered 2015-10-16 – 2015-10-17 (×2): 1 via ORAL
  Filled 2015-10-16 (×2): qty 1

## 2015-10-16 NOTE — Care Management Note (Signed)
Case Management Note  Patient Details  Name: Tracey Wells  MRN: [id number] Date of Birth: 02/03/1921  Subjective/Objective:   Pt lives with dtr who reports recent worsening dementia and need for 24/7 assistance/supervision.  PT/OT recommend SNF and dtr agrees.  After meeting with Palliative Care NP, dtr would like palliative care to follow in facility, has no agency preference.  CM talked with Scarlett Presto, liaison for [facility], and arranged for palliative care follow-up @ SNF.  Dtr will eventually want to take pt home with [facility] following with hospice services.  CSW will initiate bed search.                        Expected Discharge Plan:  Skilled Nursing Facility  In-House Referral:  Clinical Social Work  Discharge planning Services  CM Consult  Status of Service:  In process, will continue to follow  Magdalene River, RN 10/16/2015, 2:09 PM

## 2015-10-16 NOTE — Progress Notes (Signed)
Received patient from 2C, alert and oriented X 2, not in any distress, oriented to staff and to the unit. Daughter at bedside during transfer. Bed alarm on. Will contnue to monitor.

## 2015-10-16 NOTE — Progress Notes (Signed)
Subjective:  Feels better; asking about going home  Objective:   Vital Signs : Filed Vitals:   10/16/15 0600 10/16/15 0700 10/16/15 0821 10/16/15 0849  BP: 104/73 114/80 109/65 109/65  Pulse: 99 74 94 97  Temp:   98.4 F (36.9 C)   TempSrc:   Oral   Resp: 15 11 13    Height:      Weight:      SpO2: 92% 94% 93%     Intake/Output from previous day:  Intake/Output Summary (Last 24 hours) at 10/16/15 0927 Last data filed at 10/16/15 0926  Gross per 24 hour  Intake    705 ml  Output      0 ml  Net    705 ml    I/O since admission: +937  Wt Readings from Last 3 Encounters:  10/16/15 112 lb 9.6 oz (51.075 kg)  08/07/13 117 lb (53.071 kg)    Medications: . aspirin EC  81 mg Oral Daily  . cholecalciferol  1,000 Units Oral Daily  . enoxaparin (LOVENOX) injection  30 mg Subcutaneous Q24H  . famotidine  20 mg Oral Daily  . latanoprost  1 drop Left Eye QHS  . levothyroxine  125 mcg Oral QAC breakfast  . metoprolol tartrate  25 mg Oral BID  . mirtazapine  15 mg Oral QHS  . multivitamin  1 tablet Oral Daily  . phenytoin  300 mg Oral QHS    . diltiazem (CARDIZEM) infusion 5 mg/hr (10/15/15 2011)    Physical Exam:   General appearance: alert and no distress Neck: no adenopathy, no carotid bruit, no JVD, supple, symmetrical, trachea midline and thyroid not enlarged, symmetric, no tenderness/mass/nodules Back: kyphotic Lungs: no wheezing Heart: irregularly irregular rhythm 1/6 systolic murmur Abdomen: soft, non-tender; bowel sounds normal; no masses,  no organomegaly Extremities: no edema, redness or tenderness in the calves or thighs   Rate: 90-100  Rhythm: atrial fibrillation  2/28 ECG (independently read by me): AF at 128  Lab Results:   Recent Labs  10/14/15 1121 10/15/15 0428 10/16/15 0430  NA 144 141 139  K 4.2 3.8 3.7  CL 111 105 109  CO2 22 22 20*  GLUCOSE 105* 78 87  BUN 22* 17 20  CREATININE 0.95 1.01* 1.04*  CALCIUM 8.8* 8.6* 8.5*     Hepatic Function Latest Ref Rng 10/15/2015 08/08/2013 09/23/2009  Total Protein 6.5 - 8.1 g/dL 7.4 5.2(L) 6.4  Albumin 3.5 - 5.2 g/dL - 1.8(L) 3.0(L)  AST 0 - 37 U/L - 10 22  ALT 0 - 35 U/L - 6 20  Alk Phosphatase 39 - 117 U/L - 116 103  Total Bilirubin 0.3 - 1.2 mg/dL - 0.4 0.5     Recent Labs  10/14/15 1121 10/15/15 0428 10/16/15 0430  WBC 6.7 7.4 6.5  HGB 11.6* 8.2* 11.1*  HCT 35.9* 24.6* 34.3*  MCV 92.1 88.8 90.5  PLT 266 300 236    No results for input(s): TROPONINI in the last 72 hours.  Invalid input(s): CK, MB  Lab Results  Component Value Date   TSH 3.013 10/14/2015    Recent Labs  10/14/15 1850  HGBA1C 5.6     Recent Labs  10/15/15 1349  PROT 7.4   No results for input(s): INR in the last 72 hours. BNP (last 3 results)  Recent Labs  10/14/15 1850  BNP 943.3*    ProBNP (last 3 results) No results for input(s): PROBNP in the last 8760 hours.   Lipid  Panel  No results found for: CHOL, TRIG, HDL, CHOLHDL, VLDL, LDLCALC, LDLDIRECT    Imaging:  Dg Chest 1 View  10/15/2015  CLINICAL DATA:  Pleural effusions.  Status post right thoracentesis. EXAM: CHEST 1 VIEW COMPARISON:  10/14/2015 FINDINGS: Right pleural effusion has nearly completely resolved since previous study there is decreased right basilar atelectasis. No pneumothorax visualized. A persistent small to moderate left pleural effusion is seen with left lower lobe atelectasis versus infiltrate. Cardiomegaly remains stable. Improved aeration of both lungs is seen with decreased pulmonary interstitial prominence likely due to decreased interstitial edema. IMPRESSION: Near complete resolution of right pleural effusion. No pneumothorax visualized. Persistent small to moderate left pleural effusion and left lower lobe atelectasis versus infiltrate. Stable cardiomegaly. Decreased interstitial prominence likely due resolving interstitial edema. Electronically Signed   By: Earle Gell M.D.   On:  10/15/2015 13:25   Ct Chest W Contrast  10/14/2015  CLINICAL DATA:  Severe shortness of Breath and cough. EXAM: CT CHEST WITH CONTRAST TECHNIQUE: Multidetector CT imaging of the chest was performed during intravenous contrast administration. CONTRAST:  17m OMNIPAQUE IOHEXOL 300 MG/ML  SOLN COMPARISON:  None FINDINGS: Mediastinum: The heart size is normal. Aortic atherosclerosis is identified. No pericardial effusion. Calcification within the RCA and LAD coronary arteries noted. The trachea appears patent and is midline. Unremarkable appearance of the esophagus. Lungs/Pleura: Moderate to large bilateral pleural effusions identified. There is subsegmental atelectasis within the lingula. Compressive type atelectasis and consolidation is noted within both lower lobes. Upper Abdomen: Low-attenuation structure within the anterior right lobe of liver measures 8 mm and is too small to characterize. Calcified granuloma is noted within the dome of liver. The visualized portions of the adrenal glands are normal. The visualized portions of the kidneys and spleen are also normal. Musculoskeletal: There is moderate kyphosis involving the upper thoracic spine. No acute compression fractures identified. IMPRESSION: 1. Aortic atherosclerosis and multi vessel coronary artery calcification. 2. Moderate to large bilateral pleural effusions with associated compressive type atelectasis and consolidation of both lower lobes. 3. Thoracic kyphosis. Electronically Signed   By: TKerby MoorsM.D.   On: 10/14/2015 15:43   Dg Chest Portable 1 View  10/14/2015  CLINICAL DATA:  Productive cough, mid chest pain, shortness of breath EXAM: PORTABLE CHEST 1 VIEW COMPARISON:  08/07/2013 FINDINGS: Cardiomegaly is noted. Central vascular congestion and mild perihilar interstitial prominence highly suspicious for mild pulmonary edema. There is small left pleural effusion with left lower lobe atelectasis or infiltrate. Atherosclerotic  calcifications of thoracic aorta again noted. IMPRESSION: Central mild vascular congestion and mild perihilar interstitial prominence suspicious for mild pulmonary edema. Small left pleural effusion with left lower lobe atelectasis or infiltrate. Electronically Signed   By: LLahoma CrockerM.D.   On: 10/14/2015 12:10   UKoreaThoracentesis Asp Pleural Space W/img Guide  10/15/2015  INDICATION: Acute hypoxic respiratory failure. Bilateral pleural effusions. Request for diagnostic and therapeutic thoracentesis. EXAM: ULTRASOUND GUIDED RIGHT THORACENTESIS MEDICATIONS: 1% Lidocaine. COMPLICATIONS: None immediate. PROCEDURE: An ultrasound guided thoracentesis was thoroughly discussed with the patient and questions answered. The benefits, risks, alternatives and complications were also discussed. The patient understands and wishes to proceed with the procedure. Written consent was obtained. Ultrasound was performed to localize and mark an adequate pocket of fluid in the right chest. The area was then prepped and draped in the normal sterile fashion. 1% Lidocaine was used for local anesthesia. Under ultrasound guidance a 6 Fr Safe-T-Centesis catheter was introduced. Thoracentesis was performed. The catheter  was removed and a dressing applied. FINDINGS: A total of approximately 750 mls of clear yellow fluid was removed. Samples were sent to the laboratory as requested by the clinical team. IMPRESSION: Successful ultrasound guided right thoracentesis yielding 750 mls of pleural fluid. Read by:  Gareth Eagle, PA-C Electronically Signed   By: Aletta Edouard M.D.   On: 10/15/2015 13:12      Assessment/Plan:   Principal Problem:   New onset atrial fibrillation (HCC) Active Problems:   Chronic constipation   Hypothyroidism   Seizure disorder (HCC)   Acute respiratory failure with hypoxia (HCC)   Bilateral pleural effusion   Dementia   Anemia   CKD (chronic kidney disease) stage 2, GFR 60-89 ml/min   Atrial fibrillation  with controlled ventricular response (HCC)    AF HR improved with rate now 90 - 100. Off iv cardizem; on metoprolol 25 mg oral suspension.  BP is stable at 113/70.  Would not resume amlodipine for BP.  If additional med is needed consider oral cardizem.  Not a candidate for anticoagulation due to significant fall risk.  BNP elevated for CHF at least probably diastolic contributed by AF with RVR. No work-up for CAD.  Will sign off.    Troy Sine, MD, [facility] 10/16/2015, 9:27 AM

## 2015-10-16 NOTE — Consult Note (Signed)
Consultation Note Date: 10/16/2015   Patient Name: Tracey Wells  DOB: 05-21-1921  MRN: [id number]  Age / Sex: 82 y.o., female  PCP: Lavone Orn, MD Referring Physician: Jonetta Osgood, MD  Reason for Consultation: Establishing goals of care  82 year old female with history of stroke, coronary artery disease, frequent falls, remote history of seizures who developed new onset atrial fibrillation with RVR and pleural effusions just prior to this admission.   Clinical Assessment/Narrative: After examining the patient met with her daughter Izora Gala for approximately 1 hour. Shortly after that the patient's son and his wife showed up along with the nephew and his wife.  Izora Gala tells me she has cared for her mother for approximately 20 years. In the last 6 months she has watched her health decline significantly. She is frequent falls, she is eating much less, she sleeps much more and is even speaking less with her family. For the last week and a half the patient has had almost no oral intake. Saturday prior to admission she went to bed at 4 in the afternoon and did not get up until she was brought to the emergency department on Tuesday. Izora Gala has hired an Nurse, adult 5 days a week for 5 hours a day, but otherwise she has no respite from providing care for her mother. Unfortunately Izora Gala has to have back surgery soon.    During her hospital stay her heart rate has improved and the patient has stabilized. She is now eating more. Cardiology has recommended against anticoagulation due to fall risk. No further workup is recommended for CAD. Izora Gala is focused on improving her quality of life over quantity of life for her mother.  She is a DO NOT RESUSCITATE. Izora Gala does not believe invasive procedures would be helpful.  The MOST form was presented.  The family wanted to consider it and reconvene on 3/3 prior to  d/c.   Contacts/Participants in Discussion: Dtr, Izora Gala, son, nephew and their significant others. Primary Decision Maker: Filomena Jungling Relationship to Patient Dtr.   SUMMARY OF RECOMMENDATIONS  Code Status/Advance Care Planning: DNR   Other Directives:Living Will  Symptom Management:   Per primary team.  Patient does not complain of pain, dyspnea, constipation or appear to have anxiety.   Additional Recommendations (Limitations, Scope, Preferences):  Palliative recommendation is for the MOST form to be completed prior to discharge in an attempt to further define goals and hopefully avoid rehospitalization.  Psycho-social/Spiritual:  Support System: Strong   Prognosis: Less than 6 months given dementia, heart disease, declining PO intake (albumin 1.8), recurrent UTIs, and declining functionality.  Discharge Planning: [place] for rehab with Palliative care service follow-up   Chief Complaint/ Primary Diagnoses: Present on Admission:  . Chronic constipation . Hypothyroidism . New onset atrial fibrillation ([place]) . Acute respiratory failure with hypoxia ([place]) . Bilateral pleural effusion . Dementia . Anemia . CKD (chronic kidney disease) stage 2, GFR 60-89 ml/min  I have reviewed the medical record, interviewed the patient and family, and examined the patient. The following aspects are pertinent.  Past Medical History  Diagnosis Date  . Stroke ([place])   . Hypothyroidism   . Coronary artery disease   . H/O Bell's palsy 2000s  . GERD (gastroesophageal reflux disease)   . Hypertension   . Family history of adverse reaction to anesthesia     "daughter gets violently ill"  . Myocardial infarction [facility]) 1980s?  [facility] Pneumonia 2014  . Seizures ([place])     "takes  RX daily; hasn't had one in 20-30 yrs; serious issue when she was younger" (10/14/2015)  . Headache     "at least weekly" (10/14/2015)  . Arthritis     "hands, legs, arms" (10/14/2015)  . Atrial  fibrillation with RVR ([place]) 10/14/2015    new onset 10/14/2015  . Falls frequently    Social History   Social History  . Marital Status: Widowed    Spouse Name: N/A  . Number of Children: N/A  . Years of Education: N/A   Social History Main Topics  . Smoking status: Never Smoker   . Smokeless tobacco: Never Used  . Alcohol Use: No  . Drug Use: No  . Sexual Activity: No   Other Topics Concern  . None   Social History Narrative   Family History  Problem Relation Age of Onset  . Coronary artery disease Mother   . Stroke Brother    Scheduled Meds: . aspirin EC  81 mg Oral Daily  . cholecalciferol  1,000 Units Oral Daily  . enoxaparin (LOVENOX) injection  30 mg Subcutaneous Q24H  . famotidine  20 mg Oral Daily  . latanoprost  1 drop Left Eye QHS  . levothyroxine  125 mcg Oral QAC breakfast  . metoprolol tartrate  25 mg Oral BID  . mirtazapine  15 mg Oral QHS  . multivitamin  1 tablet Oral Daily  . phenytoin  300 mg Oral QHS   Continuous Infusions: . diltiazem (CARDIZEM) infusion 5 mg/hr (10/15/15 2011)   PRN Meds:.acetaminophen, acetaminophen, LORazepam, metoprolol, ondansetron (ZOFRAN) IV Medications Prior to Admission:  Prior to Admission medications   Medication Sig Start Date End Date Taking? Authorizing Provider  amLODipine (NORVASC) 5 MG tablet Take 5 mg by mouth daily.   Yes Historical Provider, MD  Lactobacillus (PROBIOTIC ACIDOPHILUS PO) Take 1 capsule by mouth daily.   Yes Historical Provider, MD  latanoprost (XALATAN) 0.005 % ophthalmic solution Place 1 drop into the left eye at bedtime.   Yes Historical Provider, MD  levothyroxine (SYNTHROID, LEVOTHROID) 125 MCG tablet Take 125 mcg by mouth daily before breakfast.   Yes Historical Provider, MD  LUTEIN-ZEAXANTHIN PO Take 1 capsule by mouth daily.   Yes Historical Provider, MD  Magnesium-Potassium (MAGNESIUM FIZZ-PLUS PO) Take 1 scoop by mouth daily.   Yes Historical Provider, MD  mirtazapine (REMERON) 15 MG  tablet Take 15 mg by mouth at bedtime.   Yes Historical Provider, MD  phenytoin (DILANTIN) 100 MG ER capsule Take 300 mg by mouth at bedtime.    Yes Historical Provider, MD  ranitidine (ZANTAC) 150 MG tablet Take 150 mg by mouth 2 (two) times daily.   Yes Historical Provider, MD  acetaminophen (TYLENOL) 500 MG tablet Take 500 mg by mouth every 6 (six) hours as needed (headache).    Historical Provider, MD  aspirin EC 81 MG tablet Take 81 mg by mouth daily.    Historical Provider, MD  beta carotene w/minerals (OCUVITE) tablet Take 1 tablet by mouth daily.    Historical Provider, MD  cholecalciferol (VITAMIN D) 1000 UNITS tablet Take 1,000 Units by mouth daily.    Historical Provider, MD  levofloxacin (LEVAQUIN) 500 MG tablet Take 1 tablet (500 mg total) by mouth daily. Patient not taking: Reported on 10/14/2015 08/12/13   Carlena Sax, MD  levothyroxine (SYNTHROID) 150 MCG tablet Take 150 mcg by mouth daily before breakfast.    Historical Provider, MD  magnesium citrate SOLN Take 2 Bottles by mouth 5 days. Every 5-7 days  approx.    Historical Provider, MD  metoprolol (LOPRESSOR) 50 MG tablet Take 25 mg by mouth 2 (two) times daily. Takes 1/2 tab    Historical Provider, MD   No Known Allergies  Review of Systems  Patient with no complaints but also pleasantly demented.  Physical Exam  Thin frail elderly female.  Very pleasant.  Confused. CV tachycardia Resp no increased work of breathing Abdomen:  Soft Nt, +BS Extremities:  Able to move all 4, no edema.  Vital Signs: BP 136/80 mmHg  Pulse 121  Temp(Src) 98.7 F (37.1 C) (Oral)  Resp 20  Ht 5' 2"  (1.575 m)  Wt 51.075 kg (112 lb 9.6 oz)  BMI 20.59 kg/m2  SpO2 96%  SpO2: SpO2: 96 % O2 Device:SpO2: 96 % O2 Flow Rate: .O2 Flow Rate (L/min): 4 L/min  IO: Intake/output summary:   Intake/Output Summary (Last 24 hours) at 10/16/15 1520 Last data filed at 10/16/15 1000  Gross per 24 hour  Intake    480 ml  Output      0 ml  Net    480  ml    LBM: Last BM Date: 10/15/15 Baseline Weight: Weight: 52.164 kg (115 lb) Most recent weight: Weight: 51.075 kg (112 lb 9.6 oz)      Palliative Assessment/Data:  Flowsheet Rows        Most Recent Value   Intake Tab    Referral Department  Hospitalist   Unit at Time of Referral  Intermediate Care Unit   Palliative Care Primary Diagnosis  Neurology   Date Notified  10/16/15   Palliative Care Type  New Palliative care   Reason for referral  Clarify Goals of Care   Date of Admission  10/14/15   Date first seen by Palliative Care  10/16/15   # of days Palliative referral response time  0 Day(s)   # of days IP prior to Palliative referral  2   Clinical Assessment    Palliative Performance Scale Score  30%   Psychosocial & Spiritual Assessment    Palliative Care Outcomes    Patient/Family meeting held?  Yes   Who was at the meeting?  Daughter, Son, Alanson Puls and their significant others.   Palliative Care Outcomes  Clarified goals of care   Palliative Care follow-up planned  Yes, Facility      Additional Data Reviewed:  CBC:    Component Value Date/Time   WBC 6.5 10/16/2015 0430   HGB 11.1* 10/16/2015 0430   HCT 34.3* 10/16/2015 0430   PLT 236 10/16/2015 0430   MCV 90.5 10/16/2015 0430   NEUTROABS 12.4* 08/07/2013 0445   LYMPHSABS 0.8 08/07/2013 0445   MONOABS 1.1* 08/07/2013 0445   EOSABS 0.0 08/07/2013 0445   BASOSABS 0.0 08/07/2013 0445   Comprehensive Metabolic Panel:    Component Value Date/Time   NA 139 10/16/2015 0430   K 3.7 10/16/2015 0430   CL 109 10/16/2015 0430   CO2 20* 10/16/2015 0430   BUN 20 10/16/2015 0430   CREATININE 1.04* 10/16/2015 0430   GLUCOSE 87 10/16/2015 0430   CALCIUM 8.5* 10/16/2015 0430   AST 10 08/08/2013 0418   ALT 6 08/08/2013 0418   ALKPHOS 116 08/08/2013 0418   BILITOT 0.4 08/08/2013 0418   PROT 7.4 10/15/2015 1349   ALBUMIN 1.8* 08/08/2013 0418     Time In: 2:00 Time Out: 3:10 Time Total: 70 min. Greater than 50%  of  this time was spent counseling and coordinating care related to  the above assessment and plan.  Signed by: Imogene Burn, PA-C Palliative Medicine Pager: [phone]  10/16/2015, 3:20 PM  Please contact Palliative Medicine Team phone at [phone] for questions and concerns.

## 2015-10-16 NOTE — Progress Notes (Signed)
PATIENT DETAILS Name: Tracey Wells Age: 82 y.o. Sex: female Date of Birth: 02-11-1921 Admit Date: 10/14/2015 Admitting Physician Ozella Rocks, MD [id number] Jomarie Longs, MD  Subjective: No SOB or chest pain. Requesting to go home  Assessment/Plan: Atrial fibrillation with RVR: Rate better,titrate off Cardizem infusion, continue metoprolol. CHADSVASCs score of at leads 6-unfortunately given advanced age, fall risk-not a candidate for anticoagulation. Continuet aspirin. await echocardiogram. Transfer to med surg.  Acute hypoxic respiratory failure: Suspect secondary to pleural effusion and A. fib RVR. Titrate off oxygen. Follow.   Bilateral pleural effusion: Underwent thoracocentesis 10/15/15-consistent with a transudate-suspect CHF-await Echo.    Hypothyroidism: Continue Synthroid-TSH within normal limits.  Seizure disorder: Continue Dilantin.  Hypertension: Currently controlled, continue metoprolol. Resume amlodipine when able.  GERD: Continue Zantac.  History of dementia: Stable-very minimally confused at best-follows all my commands. Continue Remeron.  Dysphagia: Seen by speech therapy-continue with Dys 2 diet-daughter wants to focus on comfort-accepts aspiration risk  Deconditioning: PT evaluation completed-recommendations are for SNF.  Palliative Care/Failure to thrive syndrome: Unfortunate 82 year old female with history of dementia-per Daughter she has had significant decline in her functional status over the past few months. She is totally dependent on her daughter for all daily activities of living. She has had worsening in her dysphagia-per daughter by mouth intake is minimal to none for the past few weeks. She has had significant delirium over the past few weeks as well.She is already established DO NOT RESUSCITATE. Family does not desire any heroic measures for aggressive care-they were okay with a diagnostic/therapeutic thoracocentesis. Suspect she  is appropriate for SNF with palliative/hospice following. Will consult social worker   Disposition: Remain inpatient-transfer to MedSurg unit.   Antimicrobial agents  See below  Anti-infectives    Start     Dose/Rate Route Frequency Ordered Stop   10/14/15 1615  azithromycin (ZITHROMAX) 500 mg in dextrose 5 % 250 mL IVPB  Status:  Discontinued     500 mg 250 mL/hr over 60 Minutes Intravenous  Once 10/14/15 1600 10/14/15 1650   10/14/15 1615  cefTRIAXone (ROCEPHIN) 1 g in dextrose 5 % 50 mL IVPB     1 g 100 mL/hr over 30 Minutes Intravenous  Once 10/14/15 1600 10/14/15 1716      DVT Prophylaxis: Prophylactic Lovenox   Code Status: DNR  Family Communication Daughter over the phone  Procedures: None  CONSULTS:  cardiology  Time spent 35 minutes-Greater than 50% of this time was spent in counseling, explanation of diagnosis, planning of further management, and coordination of care.  MEDICATIONS: Scheduled Meds: . aspirin EC  81 mg Oral Daily  . cholecalciferol  1,000 Units Oral Daily  . enoxaparin (LOVENOX) injection  30 mg Subcutaneous Q24H  . famotidine  20 mg Oral Daily  . latanoprost  1 drop Left Eye QHS  . levothyroxine  125 mcg Oral QAC breakfast  . metoprolol tartrate  25 mg Oral BID  . mirtazapine  15 mg Oral QHS  . multivitamin  1 tablet Oral Daily  . phenytoin  300 mg Oral QHS   Continuous Infusions: . diltiazem (CARDIZEM) infusion 5 mg/hr (10/15/15 2011)   PRN Meds:.acetaminophen, acetaminophen, ondansetron (ZOFRAN) IV    PHYSICAL EXAM: Vital signs in last 24 hours: Filed Vitals:   10/16/15 0447 10/16/15 0500 10/16/15 0600 10/16/15 0700  BP:  109/76 104/73 114/80  Pulse: 94 80 99 74  Temp:  TempSrc:      Resp: 17 13 15 11   Height:      Weight:      SpO2: 95% 93% 92% 94%    Weight change: -1.089 kg (-2 lb 6.4 oz) Filed Weights   10/14/15 1700 10/15/15 0500 10/16/15 0434  Weight: 52.164 kg (115 lb) 52.39 kg (115 lb 8 oz) 51.075 kg  (112 lb 9.6 oz)   Body mass index is 20.59 kg/(m^2).   Gen Exam: Awake and alert with clear speech.  Neck: Supple, No JVD.   Chest: B/L Clear.   CVS: S1 S2 irregular Abdomen: soft, BS +, non tender, non distended.  Extremities: no edema, lower extremities warm to touch. Neurologic: Non Focal.   Skin: No Rash.   Wounds: N/A.   Intake/Output from previous day:  Intake/Output Summary (Last 24 hours) at 10/16/15 0815 Last data filed at 10/16/15 0300  Gross per 24 hour  Intake    885 ml  Output      0 ml  Net    885 ml     LAB RESULTS: CBC  Recent Labs Lab 10/14/15 1121 10/15/15 0428 10/16/15 0430  WBC 6.7 7.4 6.5  HGB 11.6* 8.2* 11.1*  HCT 35.9* 24.6* 34.3*  PLT 266 300 236  MCV 92.1 88.8 90.5  MCH 29.7 29.6 29.3  MCHC 32.3 33.3 32.4  RDW 14.2 14.0 13.8    Chemistries   Recent Labs Lab 10/14/15 1121 10/15/15 0428 10/16/15 0430  NA 144 141 139  K 4.2 3.8 3.7  CL 111 105 109  CO2 22 22 20*  GLUCOSE 105* 78 87  BUN 22* 17 20  CREATININE 0.95 1.01* 1.04*  CALCIUM 8.8* 8.6* 8.5*    CBG: No results for input(s): GLUCAP in the last 168 hours.  GFR Estimated Creatinine Clearance: 27.3 mL/min (by C-G formula based on Cr of 1.04).  Coagulation profile No results for input(s): INR, PROTIME in the last 168 hours.  Cardiac Enzymes No results for input(s): CKMB, TROPONINI, MYOGLOBIN in the last 168 hours.  Invalid input(s): CK  Invalid input(s): POCBNP No results for input(s): DDIMER in the last 72 hours.  Recent Labs  10/14/15 1850  HGBA1C 5.6   No results for input(s): CHOL, HDL, LDLCALC, TRIG, CHOLHDL, LDLDIRECT in the last 72 hours.  Recent Labs  10/14/15 1640  TSH 3.013    Recent Labs  10/15/15 0428  VITAMINB12 347  FOLATE 10.7  FERRITIN 55  TIBC 183*  IRON 26*  RETICCTPCT 1.5   No results for input(s): LIPASE, AMYLASE in the last 72 hours.  Urine Studies No results for input(s): UHGB, CRYS in the last 72 hours.  Invalid  input(s): UACOL, UAPR, USPG, UPH, UTP, UGL, UKET, UBIL, UNIT, UROB, ULEU, UEPI, UWBC, URBC, UBAC, CAST, UCOM, BILUA  MICROBIOLOGY: Recent Results (from the past 240 hour(s))  Urine culture     Status: None (Preliminary result)   Collection Time: 10/14/15  1:07 PM  Result Value Ref Range Status   Specimen Description URINE, CATHETERIZED  Final   Special Requests NONE  Final   Culture TOO YOUNG TO READ  Final   Report Status PENDING  Incomplete  Culture, blood (Routine X 2) w Reflex to ID Panel     Status: None (Preliminary result)   Collection Time: 10/14/15  2:10 PM  Result Value Ref Range Status   Specimen Description BLOOD LEFT ANTECUBITAL  Final   Special Requests BOTTLES DRAWN AEROBIC AND ANAEROBIC 5CC  Final  Culture NO GROWTH < 24 HOURS  Final   Report Status PENDING  Incomplete  Culture, blood (Routine X 2) w Reflex to ID Panel     Status: None (Preliminary result)   Collection Time: 10/14/15  2:18 PM  Result Value Ref Range Status   Specimen Description BLOOD RIGHT ANTECUBITAL  Final   Special Requests BOTTLES DRAWN AEROBIC AND ANAEROBIC 5CC  Final   Culture NO GROWTH < 24 HOURS  Final   Report Status PENDING  Incomplete  MRSA PCR Screening     Status: None   Collection Time: 10/14/15  6:14 PM  Result Value Ref Range Status   MRSA by PCR NEGATIVE NEGATIVE Final    Comment:        The GeneXpert MRSA Assay (FDA approved for NASAL specimens only), is one component of a comprehensive MRSA colonization surveillance program. It is not intended to diagnose MRSA infection nor to guide or monitor treatment for MRSA infections.   Culture, body fluid-bottle     Status: None (Preliminary result)   Collection Time: 10/15/15 12:51 PM  Result Value Ref Range Status   Specimen Description FLUID RIGHT PLEURAL  Final   Special Requests BOTTLES DRAWN AEROBIC AND ANAEROBIC 10CCS  Final   Culture PENDING  Incomplete   Report Status PENDING  Incomplete  Gram stain     Status: None     Collection Time: 10/15/15 12:51 PM  Result Value Ref Range Status   Specimen Description FLUID RIGHT PLEURAL  Final   Special Requests NONE  Final   Gram Stain   Final    MODERATE WBC PRESENT,BOTH PMN AND MONONUCLEAR NO ORGANISMS SEEN    Report Status 10/15/2015 FINAL  Final    RADIOLOGY STUDIES/RESULTS: Dg Chest 1 View  10/15/2015  CLINICAL DATA:  Pleural effusions.  Status post right thoracentesis. EXAM: CHEST 1 VIEW COMPARISON:  10/14/2015 FINDINGS: Right pleural effusion has nearly completely resolved since previous study there is decreased right basilar atelectasis. No pneumothorax visualized. A persistent small to moderate left pleural effusion is seen with left lower lobe atelectasis versus infiltrate. Cardiomegaly remains stable. Improved aeration of both lungs is seen with decreased pulmonary interstitial prominence likely due to decreased interstitial edema. IMPRESSION: Near complete resolution of right pleural effusion. No pneumothorax visualized. Persistent small to moderate left pleural effusion and left lower lobe atelectasis versus infiltrate. Stable cardiomegaly. Decreased interstitial prominence likely due resolving interstitial edema. Electronically Signed   By: Myles Rosenthal M.D.   On: 10/15/2015 13:25   Ct Chest W Contrast  10/14/2015  CLINICAL DATA:  Severe shortness of Breath and cough. EXAM: CT CHEST WITH CONTRAST TECHNIQUE: Multidetector CT imaging of the chest was performed during intravenous contrast administration. CONTRAST:  75mL OMNIPAQUE IOHEXOL 300 MG/ML  SOLN COMPARISON:  None FINDINGS: Mediastinum: The heart size is normal. Aortic atherosclerosis is identified. No pericardial effusion. Calcification within the RCA and LAD coronary arteries noted. The trachea appears patent and is midline. Unremarkable appearance of the esophagus. Lungs/Pleura: Moderate to large bilateral pleural effusions identified. There is subsegmental atelectasis within the lingula. Compressive  type atelectasis and consolidation is noted within both lower lobes. Upper Abdomen: Low-attenuation structure within the anterior right lobe of liver measures 8 mm and is too small to characterize. Calcified granuloma is noted within the dome of liver. The visualized portions of the adrenal glands are normal. The visualized portions of the kidneys and spleen are also normal. Musculoskeletal: There is moderate kyphosis involving the upper thoracic spine. No  acute compression fractures identified. IMPRESSION: 1. Aortic atherosclerosis and multi vessel coronary artery calcification. 2. Moderate to large bilateral pleural effusions with associated compressive type atelectasis and consolidation of both lower lobes. 3. Thoracic kyphosis. Electronically Signed   By: Signa Kell M.D.   On: 10/14/2015 15:43   Dg Chest Portable 1 View  10/14/2015  CLINICAL DATA:  Productive cough, mid chest pain, shortness of breath EXAM: PORTABLE CHEST 1 VIEW COMPARISON:  08/07/2013 FINDINGS: Cardiomegaly is noted. Central vascular congestion and mild perihilar interstitial prominence highly suspicious for mild pulmonary edema. There is small left pleural effusion with left lower lobe atelectasis or infiltrate. Atherosclerotic calcifications of thoracic aorta again noted. IMPRESSION: Central mild vascular congestion and mild perihilar interstitial prominence suspicious for mild pulmonary edema. Small left pleural effusion with left lower lobe atelectasis or infiltrate. Electronically Signed   By: Natasha Mead M.D.   On: 10/14/2015 12:10   US Thoracentesis Asp Pleural Space W/img Guide  10/15/2015  INDICATION: Acute hypoxic respiratory failure. Bilateral pleural effusions. Request for diagnostic and therapeutic thoracentesis. EXAM: ULTRASOUND GUIDED RIGHT THORACENTESIS MEDICATIONS: 1% Lidocaine. COMPLICATIONS: None immediate. PROCEDURE: An ultrasound guided thoracentesis was thoroughly discussed with the patient and questions answered.  The benefits, risks, alternatives and complications were also discussed. The patient understands and wishes to proceed with the procedure. Written consent was obtained. Ultrasound was performed to localize and mark an adequate pocket of fluid in the right chest. The area was then prepped and draped in the normal sterile fashion. 1% Lidocaine was used for local anesthesia. Under ultrasound guidance a 6 Fr Safe-T-Centesis catheter was introduced. Thoracentesis was performed. The catheter was removed and a dressing applied. FINDINGS: A total of approximately 750 mls of clear yellow fluid was removed. Samples were sent to the laboratory as requested by the clinical team. IMPRESSION: Successful ultrasound guided right thoracentesis yielding 750 mls of pleural fluid. Read by:  Corrin Parker, PA-C Electronically Signed   By: Irish Lack M.D.   On: 10/15/2015 13:12    Jeoffrey Massed, MD  [facility] Pager:[phone]  If 7PM-7AM, please contact night-coverage [email or web address] Password [email or web address] 10/16/2015, 8:15 AM   LOS: 2 days

## 2015-10-16 NOTE — Progress Notes (Signed)
Speech Language Pathology Dysphagia Treatment Patient Details Name: Tracey Wells MRN: [id number] DOB: 1921/01/16 Today's Date: 10/16/2015 Time: 9747-1855 SLP Time Calculation (min) (ACUTE ONLY): 14 min  Assessment / Plan / Recommendation Clinical Impression    Pt exhibited anterior spillage and oral holding during intake of thin liquid, which are observed at home PTA per daughter report due to progression of dementia. SLP provided max verbal cues to facilitate initiation of pharyngeal swallow and to take small sips/bites. Daughter is primary caregiver and vigilant. Pt and daughter educated re: compensatory strategies (intermittent throat clear) and diet recommendation of Dysphagia 2 (fine/chopped) texture, thin liquids and meds crushed in puree. No further SLP intervention warranted.    Diet Recommendation    Dysphagia 2 (fine/chopped) texture, thin liquids, meds crushed in puree   SLP Plan All goals met      Swallowing Goals     General Behavior/Cognition: Alert;Pleasant mood;Cooperative;Requires cueing Patient Positioning: Upright in bed Oral care provided: N/A HPI: 82 year old female patient with past medical history of progressive dementia with associated dysphagia, remote history CAD, hypothyroidism, seizure disorder, chronic constipation, hypertension, Bell's palsy, and stroke who presents to the hospital with progressive failure to thrive symptoms for at least one week. Patient has slowly stopped eating and drinking for the past 1 week and has been complaining of generalized weakness, shortness of breath at rest as well as significant dyspnea on exertion. She also had nonproductive paroxysmal coughing episodes with pleuritic chest pain as well as some mild substernal chest pain. Previous MBS in 01/2015 recommended Dys 3 diet and thin liquids with no straws due to silent penetration with thin liquids by straw.  Oral Cavity - Oral Hygiene     Dysphagia Treatment Family/Caregiver  Educated: daughter Treatment Methods: Skilled observation;Differential diagnosis;Compensation strategy training;Patient/caregiver education Patient observed directly with PO's: Yes Type of PO's observed: Dysphagia 2 (chopped);Dysphagia 1 (puree);Thin liquids Feeding: Able to feed self;Needs set up Liquids provided via: Cup;No straw Oral Phase Signs & Symptoms: Oral holding;Anterior loss/spillage Pharyngeal Phase Signs & Symptoms:  (none) Type of cueing: Verbal Amount of cueing: Maximal   GO     Tonae Livolsi 10/16/2015, 12:52 PM    Titus Mould, Student-SLP

## 2015-10-16 NOTE — NC FL2 (Signed)
[place] MEDICAID FL2 LEVEL OF CARE SCREENING TOOL     IDENTIFICATION  Patient Name: Tracey Wells Birthdate: 04-04-1921 Sex: female Admission Date (Current Location): 10/14/2015  [facility] and [place] Number:  Producer, television/film/video and Address:  The [place], [address] [place], [place] [address]      Provider Number: [id number]  Attending Physician Name and Address:  Maretta Bees, MD  Relative Name and Phone Number:  Camara Renstrom - [phone]    Current Level of Care: Hospital Recommended Level of Care: Skilled Nursing Facility Prior Approval Number:    Date Approved/Denied:   PASRR Number: [id number] (Effective 10/16/15)  Discharge Plan: SNF    Current Diagnoses: Patient Active Problem List   Diagnosis Date Noted  . Acute diastolic congestive heart failure (HCC)   . Palliative care encounter   . Goals of care, counseling/discussion   . Encounter for hospice care discussion   . Atrial fibrillation with controlled ventricular response (HCC)   . CHF (congestive heart failure) (HCC) 10/14/2015  . New onset atrial fibrillation (HCC) 10/14/2015  . Acute respiratory failure with hypoxia (HCC) 10/14/2015  . Bilateral pleural effusion 10/14/2015  . Dementia 10/14/2015  . Anemia 10/14/2015  . CKD (chronic kidney disease) stage 2, GFR 60-89 ml/min 10/14/2015  . CAP (community acquired pneumonia) 08/07/2013  . Fever 08/07/2013  . Chronic constipation 08/07/2013  . Hypothyroidism 08/07/2013  . Seizure disorder (HCC) 08/07/2013    Orientation RESPIRATION BLADDER Height & Weight     Self, Time, Situation, Place  Normal Incontinent Weight: 112 lb 9.6 oz (51.075 kg) Height:   (157.5 cm)  BEHAVIORAL SYMPTOMS/MOOD NEUROLOGICAL BOWEL NUTRITION STATUS      Continent Diet (DYS 2)  AMBULATORY STATUS COMMUNICATION OF NEEDS Skin   Limited Assist Verbally Other (Comment), Skin abrasions (Skin tear left arm; bruise on left leg; genital  wart on labia)                       Personal Care Assistance Level of Assistance  Bathing, Feeding, Dressing Bathing Assistance: Limited assistance Feeding assistance: Limited assistance Dressing Assistance: Limited assistance     Functional Limitations Info  Sight, Hearing, Speech Sight Info: Adequate Hearing Info: Adequate Speech Info: Adequate    SPECIAL CARE FACTORS FREQUENCY  PT (By licensed PT), OT (By licensed OT), Speech therapy     PT Frequency: Evaluated 10/15/15  OT Frequency: Evaluated 10/16/15     Speech Therapy Frequency: Evaluated 10/15/15      Contractures Contractures Info: Not present    Additional Factors Info  Code Status, Allergies Code Status Info: DNR Allergies Info: No known allergies           Current Medications (10/16/2015):  This is the current hospital active medication list Current Facility-Administered Medications  Medication Dose Route Frequency Provider Last Rate Last Dose  . acetaminophen (TYLENOL) tablet 500 mg  500 mg Oral Q6H PRN Russella Dar, NP      . acetaminophen (TYLENOL) tablet 650 mg  650 mg Oral Q4H PRN Russella Dar, NP      . aspirin EC tablet 81 mg  81 mg Oral Daily Russella Dar, NP   81 mg at 10/16/15 0847  . cholecalciferol (VITAMIN D) tablet 1,000 Units  1,000 Units Oral Daily Russella Dar, NP   1,000 Units at 10/16/15 0848  . diltiazem (CARDIZEM) 100 mg in dextrose 5 % 100 mL (1 mg/mL) infusion  5-15 mg/hr Intravenous Titrated Russella Dar, NP 5 mL/hr at 10/15/15 2011 5 mg/hr at 10/15/15 2011  . enoxaparin (LOVENOX) injection 30 mg  30 mg Subcutaneous Q24H Lynita Lombard Malabar, RPH   30 mg at 10/16/15 1427  . famotidine (PEPCID) tablet 20 mg  20 mg Oral Daily Russella Dar, NP   20 mg at 10/16/15 0848  . latanoprost (XALATAN) 0.005 % ophthalmic solution 1 drop  1 drop Left Eye QHS Russella Dar, NP   1 drop at 10/15/15 2145  . levothyroxine (SYNTHROID, LEVOTHROID) tablet 125 mcg  125 mcg Oral QAC  breakfast Ozella Rocks, MD   125 mcg at 10/16/15 [phone]  . LORazepam (ATIVAN) tablet 1 mg  1 mg Oral Q6H PRN Maretta Bees, MD      . metoprolol (LOPRESSOR) injection 2.5 mg  2.5 mg Intravenous Q4H PRN Maretta Bees, MD      . metoprolol tartrate (LOPRESSOR) 25 mg/10 mL oral suspension 25 mg  25 mg Oral BID Lennette Bihari, MD   25 mg at 10/16/15 0849  . mirtazapine (REMERON) tablet 15 mg  15 mg Oral QHS Russella Dar, NP   15 mg at 10/15/15 2145  . multivitamin (PROSIGHT) tablet 1 tablet  1 tablet Oral Daily Maretta Bees, MD   1 tablet at 10/16/15 0849  . ondansetron (ZOFRAN) injection 4 mg  4 mg Intravenous Q6H PRN Russella Dar, NP      . phenytoin (DILANTIN) ER capsule 300 mg  300 mg Oral QHS Russella Dar, NP   300 mg at 10/15/15 2145     Discharge Medications: Please see discharge summary for a list of discharge medications.  Relevant Imaging Results:  Relevant Lab Results:   Additional Information ss# - [id number]  Cristobal Goldmann, LCSW

## 2015-10-16 NOTE — Evaluation (Signed)
Occupational Therapy Evaluation Patient Details Name: Tracey Wells MRN: [id number] DOB: [birth year] Today's Date: 10/16/2015    History of Present Illness 82 year old female patient with past medical history of progressive dementia with associated dysphagia, remote history CAD, hypothyroidism, seizure disorder, chronic constipation, hypertension and stroke who presents to the hospital with progressive failure to thrive symptoms for at least one week. Patient has slowly stopped eating and drinking for the past 1 week and has been complaining of generalized weakness, shortness of breath at rest as well as significant dyspnea on exertion. She also had nonproductive paroxysmal coughing episodes with pleuritic chest pain as well as some mild substernal chest pain.    Clinical Impression   Pt is dependent in all ADL with the exception of self feeding. She ambulates with a walker and constant close supervision due to high fall risk.  Pt is functioning at her baseline.  No acute OT needs. Recommending SNF as daughter has upcoming back surgery and is experiencing caregiver fatigue.   Follow Up Recommendations  SNF;Supervision/Assistance - 24 hour    Equipment Recommendations       Recommendations for Other Services       Precautions / Restrictions Precautions Precautions: Fall Restrictions Weight Bearing Restrictions: No      Mobility Bed Mobility   Bed Mobility: Supine to Sit;Sit to Supine     Supine to sit: Min assist;HOB elevated Sit to supine: Min assist   General bed mobility comments: pt has a double bed with a rail at home  Transfers Overall transfer level: Needs assistance   Transfers: Sit to/from Stand;Stand Pivot Transfers Sit to Stand: Min assist;From elevated surface Stand pivot transfers: Min assist            Balance   Sitting-balance support: Feet supported Sitting balance-Leahy Scale: Fair       Standing balance-Leahy Scale: Poor                               ADL Overall ADL's : At baseline                                             Vision Additional Comments: pt not able to see small print, can see gross shapes, very large print   Perception     Praxis      Pertinent Vitals/Pain Pain Assessment: No/denies pain     Hand Dominance Right   Extremity/Trunk Assessment Upper Extremity Assessment Upper Extremity Assessment: Generalized weakness (arthritic changes in hands, has a theraband HEP she does)   Lower Extremity Assessment Lower Extremity Assessment: Defer to PT evaluation   Cervical / Trunk Assessment Cervical / Trunk Assessment: Kyphotic   Communication Communication Communication: No difficulties   Cognition Arousal/Alertness: Awake/alert Behavior During Therapy: WFL for tasks assessed/performed Overall Cognitive Status: History of cognitive impairments - at baseline       Memory: Decreased recall of precautions;Decreased short-term memory             General Comments       Exercises       Shoulder Instructions      Home Living Family/patient expects to be discharged to:: Private residence Living Arrangements: Children Available Help at Discharge: Available 24 hours/day;Personal care attendant;Family Type of Home: House Home Access: Stairs to enter [employer] of Steps: 3  Entrance Stairs-Rails: Left Home Layout: One level     Bathroom Shower/Tub: Producer, television/film/video: Handicapped height     Home Equipment: Environmental consultant - 2 wheels;Shower seat;Toilet riser;Grab bars - tub/shower;Grab bars - toilet;Hand held shower head   Additional Comments: Spoke with daughter whom is experiencing heavy caregiver burden and also states that she is having backsurgery soon and does not have anyone to care for her mother. The daughter states that she has are in home caregiver 5 hours per day to give her a break because her mother can absolutely not be left  alone because she will get up on her own and fall. The daughter would like to discuss long term placement with someone. She does not think that rehab will help her because her dementia is progressing and she has noticed a progressive decline in her mother in the last 6 months.       Prior Functioning/Environment Level of Independence: Needs assistance  Gait / Transfers Assistance Needed: walks with RW and MinGuard due to fall risk  ADL's / Homemaking Assistance Needed: Pt self feeds and is otherwise dependent.        OT Diagnosis: Generalized weakness;Cognitive deficits;Blindness and low vision   OT Problem List:     OT Treatment/Interventions:      OT Goals(Current goals can be found in the care plan section) Acute Rehab OT Goals Patient Stated Goal: To go home.   OT Frequency:     Barriers to D/C:            Co-evaluation              End of Session Equipment Utilized During Treatment: Gait belt  Activity Tolerance: Patient tolerated treatment well Patient left: in bed;with call bell/phone within reach;with bed alarm set;with family/visitor present   Time: 1610-9604 OT Time Calculation (min): 13 min Charges:  OT General Charges $OT Visit: 1 Procedure OT Evaluation $OT Eval Moderate Complexity: 1 Procedure G-Codes:    Evern Bio 10/16/2015, 11:31 AM  [phone]

## 2015-10-16 NOTE — Clinical Social Work Note (Signed)
CSW received consult for SNF placement today. Talked with daughter and assessment completed - full assessment to follow. Sent out patient's information to facilities in [facility]. CSW will f/u with daughter on Friday with facility responses.   Genelle Bal, MSW, LCSW Licensed Clinical Social Worker Clinical Social Work Department [employer] [phone]

## 2015-10-17 DIAGNOSIS — E038 Other specified hypothyroidism: Secondary | ICD-10-CM

## 2015-10-17 DIAGNOSIS — Z7189 Other specified counseling: Secondary | ICD-10-CM

## 2015-10-17 DIAGNOSIS — Z515 Encounter for palliative care: Secondary | ICD-10-CM

## 2015-10-17 DIAGNOSIS — I509 Heart failure, unspecified: Secondary | ICD-10-CM

## 2015-10-17 LAB — CBC
HEMATOCRIT: 35.4 % — AB (ref 36.0–46.0)
Hemoglobin: 11.8 g/dL — ABNORMAL LOW (ref 12.0–15.0)
MCH: 29.9 pg (ref 26.0–34.0)
MCHC: 33.3 g/dL (ref 30.0–36.0)
MCV: 89.8 fL (ref 78.0–100.0)
Platelets: 293 10*3/uL (ref 150–400)
RBC: 3.94 MIL/uL (ref 3.87–5.11)
RDW: 13.8 % (ref 11.5–15.5)
WBC: 6 10*3/uL (ref 4.0–10.5)

## 2015-10-17 MED ORDER — LORAZEPAM 1 MG PO TABS: 1.0000 mg | ORAL_TABLET | Freq: Four times a day (QID) | ORAL | Status: AC | PRN

## 2015-10-17 MED ORDER — MORPHINE SULFATE (CONCENTRATE) 10 MG/0.5ML PO SOLN: 5.0000 mg | ORAL | Status: AC | PRN

## 2015-10-17 MED ORDER — MORPHINE SULFATE (CONCENTRATE) 10 MG/0.5ML PO SOLN: 5.0000 mg | ORAL | Status: DC | PRN

## 2015-10-17 NOTE — Progress Notes (Signed)
Daily Progress Note   Patient Name: Tracey Wells       Date: 10/17/2015 DOB: [birth year]  Age: 82 y.o. MRN#: [id number] Attending Physician: Tracey Osgood, MD Primary Care Physician: Tracey Shelling, MD Admit Date: 10/14/2015  Reason for Consultation/Follow-up: Establishing goals of care  Subjective: Tracey Wells is pleasantly confused.  She denies complaints and told me three times that she wants someone to know "all these kids up here are doing such a nice job taking care of me."  I met with her daughter Tracey Wells, [place]) and her son. We reviewed a MOST form and discussed how to develop plan of care to focus on continuing therapies that would maximize chance of being well enough to return home and limiting therapies not in line with this goal.  Length of Stay: 3 days  Current Medications: Scheduled Meds:  . aspirin EC  81 mg Oral Daily  . cholecalciferol  1,000 Units Oral Daily  . enoxaparin (LOVENOX) injection  30 mg Subcutaneous Q24H  . famotidine  20 mg Oral Daily  . latanoprost  1 drop Left Eye QHS  . levothyroxine  125 mcg Oral QAC breakfast  . metoprolol tartrate  25 mg Oral BID  . mirtazapine  15 mg Oral QHS  . multivitamin  1 tablet Oral Daily  . phenytoin  300 mg Oral QHS    Continuous Infusions:    PRN Meds: acetaminophen, acetaminophen, LORazepam, metoprolol, morphine CONCENTRATE, ondansetron (ZOFRAN) IV  Physical Exam: Physical Exam              Vital Signs: BP 117/89 mmHg  Pulse 94  Temp(Src) 98.5 F (36.9 C) (Oral)  Resp 17  Ht 5' 2"  (1.575 m)  Wt 50.062 kg (110 lb 5.9 oz)  BMI 20.18 kg/m2  SpO2 98% SpO2: SpO2: 98 % O2 Device: O2 Device: Not Delivered O2 Flow Rate: O2 Flow Rate (L/min): 4 L/min  Intake/output summary:  Intake/Output Summary  (Last 24 hours) at 10/17/15 1302 Last data filed at 10/17/15 2025  Gross per 24 hour  Intake    120 ml  Output      0 ml  Net    120 ml   LBM: Last BM Date: 10/14/15 Baseline Weight: Weight: 52.164 kg (115 lb) Most recent weight: Weight: 50.062 kg (110 lb 5.9 oz)       Palliative Assessment/Data: Flowsheet Rows        Most Recent Value   Intake Tab    Referral Department  Hospitalist   Unit at Time of Referral  Intermediate Care Unit   Palliative Care Primary Diagnosis  Neurology   Date Notified  10/16/15   Palliative Care Type  New Palliative care   Reason for referral  Clarify Goals of Care   Date of Admission  10/14/15   Date first seen by Palliative Care  10/16/15   # of days Palliative referral response time  0 Day(s)   # of days IP prior to Palliative referral  2   Clinical Assessment    Palliative Performance Scale Score  30%   Psychosocial & Spiritual Assessment    Palliative Care Outcomes    Patient/Family meeting held?  Yes  Who was at the meeting?  Daughter, Son, Tracey Wells and their significant others.   Palliative Care Outcomes  Clarified goals of care   Palliative Care follow-up planned  Yes, Facility      Additional Data Reviewed: CBC    Component Value Date/Time   WBC 6.0 10/17/2015 0826   RBC 3.94 10/17/2015 0826   RBC 2.77* 10/15/2015 0428   HGB 11.8* 10/17/2015 0826   HCT 35.4* 10/17/2015 0826   PLT 293 10/17/2015 0826   MCV 89.8 10/17/2015 0826   MCH 29.9 10/17/2015 0826   MCHC 33.3 10/17/2015 0826   RDW 13.8 10/17/2015 0826   LYMPHSABS 0.8 08/07/2013 0445   MONOABS 1.1* 08/07/2013 0445   EOSABS 0.0 08/07/2013 0445   BASOSABS 0.0 08/07/2013 0445    CMP     Component Value Date/Time   NA 139 10/16/2015 0430   K 3.7 10/16/2015 0430   CL 109 10/16/2015 0430   CO2 20* 10/16/2015 0430   GLUCOSE 87 10/16/2015 0430   BUN 20 10/16/2015 0430   CREATININE 1.04* 10/16/2015 0430   CALCIUM 8.5* 10/16/2015 0430   PROT 7.4 10/15/2015 1349    ALBUMIN 1.8* 08/08/2013 0418   AST 10 08/08/2013 0418   ALT 6 08/08/2013 0418   ALKPHOS 116 08/08/2013 0418   BILITOT 0.4 08/08/2013 0418   GFRNONAA 45* 10/16/2015 0430   GFRAA 52* 10/16/2015 0430       Problem List:  Patient Active Problem List   Diagnosis Date Noted  . Acute diastolic congestive heart failure ([place])   . Palliative care encounter   . Goals of care, counseling/discussion   . Encounter for hospice care discussion   . Atrial fibrillation with controlled ventricular response ([place])   . CHF (congestive heart failure) ([place]) 10/14/2015  . New onset atrial fibrillation ([place]) 10/14/2015  . Acute respiratory failure with hypoxia ([place]) 10/14/2015  . Bilateral pleural effusion 10/14/2015  . Dementia 10/14/2015  . Anemia 10/14/2015  . CKD (chronic kidney disease) stage 2, GFR 60-89 ml/min 10/14/2015  . CAP (community acquired pneumonia) 08/07/2013  . Fever 08/07/2013  . Chronic constipation 08/07/2013  . Hypothyroidism 08/07/2013  . Seizure disorder ([place]) 08/07/2013     Palliative Care Assessment & Plan    1.Code Status:  DNR    Code Status Orders        Start     Ordered   10/14/15 1813  Do not attempt resuscitation (DNR)   Continuous    Question Answer Comment  In the event of cardiac or respiratory ARREST Do not call a "code blue"   In the event of cardiac or respiratory ARREST Do not perform Intubation, CPR, defibrillation or ACLS   In the event of cardiac or respiratory ARREST Use medication by any route, position, wound care, and other measures to relive pain and suffering. May use oxygen, suction and manual treatment of airway obstruction as needed for comfort.      10/14/15 1813    Code Status History    Date Active Date Inactive Code Status Order ID Comments User Context   08/07/2013 11:27 AM 08/12/2013  3:16 PM Full Code [id number]  Bonnielee Haff, MD Inpatient    Advance Directive Documentation        Most Recent Value   Type of Advance  Directive  Healthcare Power of [place] [dated 02/25/2004]   Pre-existing out of facility DNR order (yellow form or pink MOST form)     "MOST" Form in Place?  2. Goals of Care/Additional Recommendations:  Plan for transition to [facility] to see how much functional status she can recover.  We completed MOST form today. DNR, Limited additional interventions, IVF and ABX if indicated, no feeding tube.  [place] to follow at [facility].  I think that there is a high likelihood that she will transition to hospice services in the near future.  3. Symptom Management:      1.Denies complaints today.  4. Palliative Prophylaxis:   Bowel Regimen, Delirium Protocol and Frequent Pain Assessment  5. Prognosis: < 6 months  6. Discharge Planning:  [place] for rehab with Palliative care service follow-up   Care plan was discussed with Tracey Wells, daughter/POA  Thank you for allowing the Palliative Medicine Team to assist in the care of this patient.   Time In: 1215 Time Out: 1300 Total Time 45 Prolonged Time Billed no        Micheline Rough, MD  10/17/2015, 1:02 PM  Please contact Palliative Medicine Team phone at [phone] for questions and concerns.

## 2015-10-17 NOTE — Progress Notes (Signed)
CSW has spoken with pt's daughter re: SNF bed offers and she has chosen [facility].  Daughter completed admission paperwork at the facility today.  [facility] and [facility].  CSW continues to follow for disposition.  Pollyann SavoyJody Tammee Thielke, [place] Coverage [phone]

## 2015-10-17 NOTE — Discharge Summary (Signed)
PATIENT DETAILS Name: Tracey Wells Age: 82 y.o. Sex: female Date of Birth: 06/27/1921 MRN: [id number]. Admitting Physician: Ozella Rocks, MD ION:[id number],XLKG Jomarie Longs, MD  Admit Date: 10/14/2015 Discharge date: 10/17/2015  Recommendations for Outpatient Follow-up:  1. Palliative care/hospice follow-up at SNF. 2. Goals are mostly for comfort.  3. Pleural fluid cytology pending-please follow  PRIMARY DISCHARGE DIAGNOSIS:  Principal Problem:   New onset atrial fibrillation (HCC) Active Problems:   Chronic constipation   Hypothyroidism   Seizure disorder (HCC)   Acute respiratory failure with hypoxia (HCC)   Bilateral pleural effusion   Dementia   Anemia   CKD (chronic kidney disease) stage 2, GFR 60-89 ml/min   Atrial fibrillation with controlled ventricular response (HCC)   Acute diastolic congestive heart failure (HCC)   Palliative care encounter   Goals of care, counseling/discussion   Encounter for hospice care discussion      PAST MEDICAL HISTORY: Past Medical History  Diagnosis Date  . Stroke (HCC)   . Hypothyroidism   . Coronary artery disease   . H/O Bell's palsy 2000s  . GERD (gastroesophageal reflux disease)   . Hypertension   . Family history of adverse reaction to anesthesia     "daughter gets violently ill"  . Myocardial infarction [facility] [place]) 1980s?  Marland Kitchen Pneumonia 2014  . Seizures (HCC)     "takes RX daily; hasn't had one in 20-30 yrs; serious issue when she was younger" (10/14/2015)  . Headache     "at least weekly" (10/14/2015)  . Arthritis     "hands, legs, arms" (10/14/2015)  . Atrial fibrillation with RVR (HCC) 10/14/2015    new onset 10/14/2015  . Falls frequently     DISCHARGE MEDICATIONS: Current Discharge Medication List    START taking these medications   Details  LORazepam (ATIVAN) 1 MG tablet Take 1 tablet (1 mg total) by mouth every 6 (six) hours as needed for anxiety, sedation or sleep. Qty: 30 tablet, Refills: 0    Morphine Sulfate  (MORPHINE CONCENTRATE) 10 MG/0.5ML SOLN concentrated solution Take 0.25 mLs (5 mg total) by mouth every 4 (four) hours as needed for moderate pain, severe pain, anxiety or shortness of breath. Qty: 30 mL, Refills: 0      CONTINUE these medications which have NOT CHANGED   Details  Lactobacillus (PROBIOTIC ACIDOPHILUS PO) Take 1 capsule by mouth daily.    latanoprost (XALATAN) 0.005 % ophthalmic solution Place 1 drop into the left eye at bedtime.    LUTEIN-ZEAXANTHIN PO Take 1 capsule by mouth daily.    Magnesium-Potassium (MAGNESIUM FIZZ-PLUS PO) Take 1 scoop by mouth daily.    mirtazapine (REMERON) 15 MG tablet Take 15 mg by mouth at bedtime.    phenytoin (DILANTIN) 100 MG ER capsule Take 300 mg by mouth at bedtime.     ranitidine (ZANTAC) 150 MG tablet Take 150 mg by mouth 2 (two) times daily.    acetaminophen (TYLENOL) 500 MG tablet Take 500 mg by mouth every 6 (six) hours as needed (headache).    aspirin EC 81 MG tablet Take 81 mg by mouth daily.    beta carotene w/minerals (OCUVITE) tablet Take 1 tablet by mouth daily.    cholecalciferol (VITAMIN D) 1000 UNITS tablet Take 1,000 Units by mouth daily.    levothyroxine (SYNTHROID) 150 MCG tablet Take 150 mcg by mouth daily before breakfast.    magnesium citrate SOLN Take 2 Bottles by mouth 5 days. Every 5-7 days approx.    metoprolol (LOPRESSOR) 50  MG tablet Take 25 mg by mouth 2 (two) times daily. Takes 1/2 tab      STOP taking these medications     amLODipine (NORVASC) 5 MG tablet      levofloxacin (LEVAQUIN) 500 MG tablet         ALLERGIES:  No Known Allergies  BRIEF HPI:  See H&P, Labs, Consult and Test reports for all details in brief, patient was admitted for evaluation of confusion, worsening shortness of breath. She was found to be in A. fib RVR. Chest x-ray revealed large pleural effusion. See below for further details   CONSULTATIONS:   cardiology and Palliative care  PERTINENT RADIOLOGIC STUDIES: Dg  Chest 1 View  10/15/2015  CLINICAL DATA:  Pleural effusions.  Status post right thoracentesis. EXAM: CHEST 1 VIEW COMPARISON:  10/14/2015 FINDINGS: Right pleural effusion has nearly completely resolved since previous study there is decreased right basilar atelectasis. No pneumothorax visualized. A persistent small to moderate left pleural effusion is seen with left lower lobe atelectasis versus infiltrate. Cardiomegaly remains stable. Improved aeration of both lungs is seen with decreased pulmonary interstitial prominence likely due to decreased interstitial edema. IMPRESSION: Near complete resolution of right pleural effusion. No pneumothorax visualized. Persistent small to moderate left pleural effusion and left lower lobe atelectasis versus infiltrate. Stable cardiomegaly. Decreased interstitial prominence likely due resolving interstitial edema. Electronically Signed   By: Myles Rosenthal M.D.   On: 10/15/2015 13:25   Ct Chest W Contrast  10/14/2015  CLINICAL DATA:  Severe shortness of Breath and cough. EXAM: CT CHEST WITH CONTRAST TECHNIQUE: Multidetector CT imaging of the chest was performed during intravenous contrast administration. CONTRAST:  75mL OMNIPAQUE IOHEXOL 300 MG/ML  SOLN COMPARISON:  None FINDINGS: Mediastinum: The heart size is normal. Aortic atherosclerosis is identified. No pericardial effusion. Calcification within the RCA and LAD coronary arteries noted. The trachea appears patent and is midline. Unremarkable appearance of the esophagus. Lungs/Pleura: Moderate to large bilateral pleural effusions identified. There is subsegmental atelectasis within the lingula. Compressive type atelectasis and consolidation is noted within both lower lobes. Upper Abdomen: Low-attenuation structure within the anterior right lobe of liver measures 8 mm and is too small to characterize. Calcified granuloma is noted within the dome of liver. The visualized portions of the adrenal glands are normal. The visualized  portions of the kidneys and spleen are also normal. Musculoskeletal: There is moderate kyphosis involving the upper thoracic spine. No acute compression fractures identified. IMPRESSION: 1. Aortic atherosclerosis and multi vessel coronary artery calcification. 2. Moderate to large bilateral pleural effusions with associated compressive type atelectasis and consolidation of both lower lobes. 3. Thoracic kyphosis. Electronically Signed   By: Signa Kell M.D.   On: 10/14/2015 15:43   Dg Chest Portable 1 View  10/14/2015  CLINICAL DATA:  Productive cough, mid chest pain, shortness of breath EXAM: PORTABLE CHEST 1 VIEW COMPARISON:  08/07/2013 FINDINGS: Cardiomegaly is noted. Central vascular congestion and mild perihilar interstitial prominence highly suspicious for mild pulmonary edema. There is small left pleural effusion with left lower lobe atelectasis or infiltrate. Atherosclerotic calcifications of thoracic aorta again noted. IMPRESSION: Central mild vascular congestion and mild perihilar interstitial prominence suspicious for mild pulmonary edema. Small left pleural effusion with left lower lobe atelectasis or infiltrate. Electronically Signed   By: Natasha Mead M.D.   On: 10/14/2015 12:10   US Thoracentesis Asp Pleural Space W/img Guide  10/15/2015  INDICATION: Acute hypoxic respiratory failure. Bilateral pleural effusions. Request for diagnostic and therapeutic thoracentesis. EXAM:  ULTRASOUND GUIDED RIGHT THORACENTESIS MEDICATIONS: 1% Lidocaine. COMPLICATIONS: None immediate. PROCEDURE: An ultrasound guided thoracentesis was thoroughly discussed with the patient and questions answered. The benefits, risks, alternatives and complications were also discussed. The patient understands and wishes to proceed with the procedure. Written consent was obtained. Ultrasound was performed to localize and mark an adequate pocket of fluid in the right chest. The area was then prepped and draped in the normal sterile  fashion. 1% Lidocaine was used for local anesthesia. Under ultrasound guidance a 6 Fr Safe-T-Centesis catheter was introduced. Thoracentesis was performed. The catheter was removed and a dressing applied. FINDINGS: A total of approximately 750 mls of clear yellow fluid was removed. Samples were sent to the laboratory as requested by the clinical team. IMPRESSION: Successful ultrasound guided right thoracentesis yielding 750 mls of pleural fluid. Read by:  Corrin ParkerWendy Blair, PA-C Electronically Signed   By: Irish LackGlenn  Yamagata M.D.   On: 10/15/2015 13:12     PERTINENT LAB RESULTS: CBC:  Recent Labs  10/16/15 0430 10/17/15 0826  WBC 6.5 6.0  HGB 11.1* 11.8*  HCT 34.3* 35.4*  PLT 236 293   CMET CMP     Component Value Date/Time   NA 139 10/16/2015 0430   K 3.7 10/16/2015 0430   CL 109 10/16/2015 0430   CO2 20* 10/16/2015 0430   GLUCOSE 87 10/16/2015 0430   BUN 20 10/16/2015 0430   CREATININE 1.04* 10/16/2015 0430   CALCIUM 8.5* 10/16/2015 0430   PROT 7.4 10/15/2015 1349   ALBUMIN 1.8* 08/08/2013 0418   AST 10 08/08/2013 0418   ALT 6 08/08/2013 0418   ALKPHOS 116 08/08/2013 0418   BILITOT 0.4 08/08/2013 0418   GFRNONAA 45* 10/16/2015 0430   GFRAA 52* 10/16/2015 0430    GFR Estimated Creatinine Clearance: 27.3 mL/min (by C-G formula based on Cr of 1.04). No results for input(s): LIPASE, AMYLASE in the last 72 hours. No results for input(s): CKTOTAL, CKMB, CKMBINDEX, TROPONINI in the last 72 hours. Invalid input(s): POCBNP No results for input(s): DDIMER in the last 72 hours.  Recent Labs  10/14/15 1850  HGBA1C 5.6   No results for input(s): CHOL, HDL, LDLCALC, TRIG, CHOLHDL, LDLDIRECT in the last 72 hours.  Recent Labs  10/14/15 1640  TSH 3.013    Recent Labs  10/15/15 0428  VITAMINB12 347  FOLATE 10.7  FERRITIN 55  TIBC 183*  IRON 26*  RETICCTPCT 1.5   Coags: No results for input(s): INR in the last 72 hours.  Invalid input(s): PT Microbiology: Recent  Results (from the past 240 hour(s))  Urine culture     Status: None   Collection Time: 10/14/15  1:07 PM  Result Value Ref Range Status   Specimen Description URINE, CATHETERIZED  Final   Special Requests NONE  Final   Culture >=100,000 COLONIES/mL AEROCOCCUS URINAE  Final   Report Status 10/16/2015 FINAL  Final  Culture, blood (Routine X 2) w Reflex to ID Panel     Status: None (Preliminary result)   Collection Time: 10/14/15  2:10 PM  Result Value Ref Range Status   Specimen Description BLOOD LEFT ANTECUBITAL  Final   Special Requests BOTTLES DRAWN AEROBIC AND ANAEROBIC 5CC  Final   Culture NO GROWTH 2 DAYS  Final   Report Status PENDING  Incomplete  Culture, blood (Routine X 2) w Reflex to ID Panel     Status: None (Preliminary result)   Collection Time: 10/14/15  2:18 PM  Result Value Ref Range Status  Specimen Description BLOOD RIGHT ANTECUBITAL  Final   Special Requests BOTTLES DRAWN AEROBIC AND ANAEROBIC 5CC  Final   Culture NO GROWTH 2 DAYS  Final   Report Status PENDING  Incomplete  MRSA PCR Screening     Status: None   Collection Time: 10/14/15  6:14 PM  Result Value Ref Range Status   MRSA by PCR NEGATIVE NEGATIVE Final    Comment:        The GeneXpert MRSA Assay (FDA approved for NASAL specimens only), is one component of a comprehensive MRSA colonization surveillance program. It is not intended to diagnose MRSA infection nor to guide or monitor treatment for MRSA infections.   Respiratory virus panel     Status: None   Collection Time: 10/14/15  7:52 PM  Result Value Ref Range Status   Respiratory Syncytial Virus A Negative Negative Final   Respiratory Syncytial Virus B Negative Negative Final   Influenza A Negative Negative Final   Influenza B Negative Negative Final   Parainfluenza 1 Negative Negative Final   Parainfluenza 2 Negative Negative Final   Parainfluenza 3 Negative Negative Final   Metapneumovirus Negative Negative Final   Rhinovirus Negative  Negative Final   Adenovirus Negative Negative Final    Comment: (NOTE) Performed At: [facility] [address] [place], [place] [id number] Mila Homer MD WU:[id number]   Culture, body fluid-bottle     Status: None (Preliminary result)   Collection Time: 10/15/15 12:51 PM  Result Value Ref Range Status   Specimen Description FLUID RIGHT PLEURAL  Final   Special Requests BOTTLES DRAWN AEROBIC AND ANAEROBIC 10CCS  Final   Culture NO GROWTH < 24 HOURS  Final   Report Status PENDING  Incomplete  Gram stain     Status: None   Collection Time: 10/15/15 12:51 PM  Result Value Ref Range Status   Specimen Description FLUID RIGHT PLEURAL  Final   Special Requests NONE  Final   Gram Stain   Final    MODERATE WBC PRESENT,BOTH PMN AND MONONUCLEAR NO ORGANISMS SEEN    Report Status 10/15/2015 FINAL  Final     BRIEF HOSPITAL COURSE:  Atrial fibrillation with RVR: Rate much better controlled,titrated off Cardizem infusion,  and subsequently started on oral metoprolol. CHADSVASCs score of at leads 6-unfortunately given advanced age, fall risk-not a candidate for anticoagulation. Continuet aspirin. echocardiogram showed reduced systolic function around 45% with diffuse hypokinesis.   Acute hypoxic respiratory failure: Suspect secondary to pleural effusion and A. fib RVR. Titrated off oxygen. Follow.   Bilateral pleural effusion: Underwent thoracocentesis 10/15/15-consistent with a transudate-suspect CHF. pleural fluid cytology pending-please follow.   Hypothyroidism: Continue Synthroid-TSH within normal limits.  Seizure disorder: Continue Dilantin.  Hypertension: Currently controlled, continue metoprolol.Amlodipine currently on hold-if needed would add Cardizem for better rate control.   GERD: Continue Zantac.  History of dementia: Stable-very minimally confused at best-follows all my commands. Continue Remeron.  Dysphagia: Seen by speech therapy-continue with Dys 2  diet-daughter wants to focus on comfort-accepts aspiration risk  Deconditioning: PT evaluation completed-recommendations are for SNF.  Palliative Care/Failure to thrive syndrome: Unfortunate 82 year old female with history of dementia-per Daughter she has had significant decline in her functional status over the past few months. She is totally dependent on her daughter for all daily activities of living. She has had worsening in her dysphagia-per daughter by mouth intake is minimal to none for the past few weeks. She has had significant delirium over the past few weeks as well.She  is already established DO NOT RESUSCITATE. Family does not desire any heroic measures for aggressive care-they were okay with a diagnostic/therapeutic thoracocentesis.  after discussion with family by the palliative care team, recommendations are for discharge to SNF with palliative/hospice following.   TODAY-DAY OF DISCHARGE:  Subjective:   Tracey Wells today has no headache,no chest abdominal pain,no new weakness tingling or numbness, feels much better wants to go home today.   Objective:   Blood pressure 118/77, pulse 94, temperature 97.4 F (36.3 C), temperature source Oral, resp. rate 17, height 5\' 2"  (1.575 m), weight 50.062 kg (110 lb 5.9 oz), SpO2 96 %.  Intake/Output Summary (Last 24 hours) at 10/17/15 1032 Last data filed at 10/17/15 1610  Gross per 24 hour  Intake    120 ml  Output      0 ml  Net    120 ml   Filed Weights   10/15/15 0500 10/16/15 0434 10/17/15 0443  Weight: 52.39 kg (115 lb 8 oz) 51.075 kg (112 lb 9.6 oz) 50.062 kg (110 lb 5.9 oz)    Exam Awake Alert, Oriented *3, No new F.N deficits, Normal affect [place].AT,PERRAL Supple Neck,No JVD, No cervical lymphadenopathy appriciated.  Symmetrical Chest wall movement, Good air movement bilaterally, CTAB RRR,No Gallops,Rubs or new Murmurs, No Parasternal Heave +ve B.Sounds, Abd Soft, Non tender, No organomegaly appriciated, No rebound  -guarding or rigidity. No Cyanosis, Clubbing or edema, No new Rash or bruise  DISCHARGE CONDITION: Stable  DISPOSITION: SNF  DISCHARGE INSTRUCTIONS:    Activity:  As tolerated with Full fall precautions use walker/cane & assistance as needed  Get Medicines reviewed and adjusted: Please take all your medications with you for your next visit with your Primary MD  Please request your Primary MD to go over all hospital tests and procedure/radiological results at the follow up, please ask your Primary MD to get all Hospital records sent to his/her office.  If you experience worsening of your admission symptoms, develop shortness of breath, life threatening emergency, suicidal or homicidal thoughts you must seek medical attention immediately by calling 911 or calling your MD immediately  if symptoms less severe.  You must read complete instructions/literature along with all the possible adverse reactions/side effects for all the Medicines you take and that have been prescribed to you. Take any new Medicines after you have completely understood and accpet all the possible adverse reactions/side effects.   Do not drive when taking Pain medications.   Do not take more than prescribed Pain, Sleep and Anxiety Medications  Special Instructions: If you have smoked or chewed Tobacco  in the last 2 yrs please stop smoking, stop any regular Alcohol  and or any Recreational drug use.  Wear Seat belts while driving.  Please note  You were cared for by a hospitalist during your hospital stay. Once you are discharged, your primary care physician will handle any further medical issues. Please note that NO REFILLS for any discharge medications will be authorized once you are discharged, as it is imperative that you return to your primary care physician (or establish a relationship with a primary care physician if you do not have one) for your aftercare needs so that they can reassess your need for  medications and monitor your lab values.  Diet recommendation: Dysphagia 2 (fine/chopped) texture, thin liquids, meds crushed in puree  Discharge Instructions    Diet general    Complete by:  As directed   Dysphagia 2 (fine/chopped) texture, thin liquids, meds crushed  in puree     Increase activity slowly    Complete by:  As directed            Follow-up Information    Follow up with Lillia Mountain, MD. Schedule an appointment as soon as possible for a visit in 1 week.   Specialty:  Internal Medicine   Why:  As needed   Contact information:   [address] [place] [phone]       Total Time spent on discharge equals 45 minutes.  SignedJeoffrey Massed 10/17/2015 10:32 AM

## 2015-10-17 NOTE — Progress Notes (Signed)
Physical Therapy Treatment Patient Details Name: Tracey Wells MRN: [id number] DOB: 02-Sep-1920 Today's Date: 10/17/2015    History of Present Illness 82 year old female patient with past medical history of progressive dementia with associated dysphagia, remote history CAD, hypothyroidism, seizure disorder, chronic constipation, hypertension and stroke who presents to the hospital with progressive failure to thrive symptoms for at least one week. Patient has slowly stopped eating and drinking for the past 1 week and has been complaining of generalized weakness, shortness of breath at rest as well as significant dyspnea on exertion. She also had nonproductive paroxysmal coughing episodes with pleuritic chest pain as well as some mild substernal chest pain.     PT Comments    Pt reports she feels tired but agreeable to treatment. Pt min A with all gait and mobility, needs encouragement to continue standing when fatigued as she tends to want to sit before she is near the chair. Pt will continue to benefit from skilled PT services to improve strength and mobility.  Follow Up Recommendations  SNF     Equipment Recommendations  None recommended by PT    Recommendations for Other Services       Precautions / Restrictions Precautions Precautions: Fall Restrictions Weight Bearing Restrictions: No    Mobility  Bed Mobility               General bed mobility comments: Pt up in bed at PT arrival  Transfers Overall transfer level: Needs assistance Equipment used: Rolling walker (2 wheeled)   Sit to Stand: Min assist Stand pivot transfers: Min assist       General transfer comment: Pt requires min lifting assist for sit to stand from recliner, min steadying assist with pivoting  Ambulation/Gait Ambulation/Gait assistance: Min assist Ambulation Distance (Feet): 70 Feet Assistive device: Rolling walker (2 wheeled)     Gait velocity interpretation: Below normal speed for  age/gender General Gait Details: Pt needs cues for posture, decreased step and stride length, encouragement to continue walking vs sitting when fatigued   Stairs            Wheelchair Mobility    Modified Rankin (Stroke Patients Only)       Balance                                    Cognition Arousal/Alertness: Awake/alert Behavior During Therapy: WFL for tasks assessed/performed Overall Cognitive Status: History of cognitive impairments - at baseline       Memory: Decreased recall of precautions;Decreased short-term memory              Exercises General Exercises - Lower Extremity Ankle Circles/Pumps: AROM;Both;10 reps Long Arc Quad: AROM;Both;10 reps Hip ABduction/ADduction: AROM;Both;10 reps Hip Flexion/Marching: AROM;Both;10 reps    General Comments        Pertinent Vitals/Pain Pain Assessment: No/denies pain    Home Living                      Prior Function            PT Goals (current goals can now be found in the care plan section) Progress towards PT goals: Progressing toward goals    Frequency  Min 2X/week    PT Plan Current plan remains appropriate    Co-evaluation             End of Session Equipment Utilized During Treatment: Gait belt  Activity Tolerance: Patient tolerated treatment well Patient left: in chair;with call bell/phone within reach;with chair alarm set     Time: 0950-1005 PT Time Calculation (min) (ACUTE ONLY): 15 min  Charges:  $Therapeutic Exercise: 8-22 mins                    G Codes:      DONAWERTH,KAREN 11/10/2015, 10:11 AM

## 2015-10-17 NOTE — Progress Notes (Signed)
Report cal;led to [facility], report given to Antoniette

## 2015-10-19 LAB — CULTURE, BLOOD (ROUTINE X 2)
CULTURE: NO GROWTH
Culture: NO GROWTH

## 2015-10-20 LAB — CULTURE, BODY FLUID-BOTTLE: CULTURE: NO GROWTH

## 2015-12-15 DEATH — deceased

## 2017-07-24 IMAGING — CR DG CHEST 1V PORT
1 series · 1 of 1 positions shown · non-contrast
Comparison: 08/07/2013

CLINICAL DATA: Productive cough, mid chest pain, shortness of
breath

EXAM:
PORTABLE CHEST 1 VIEW

[AP]
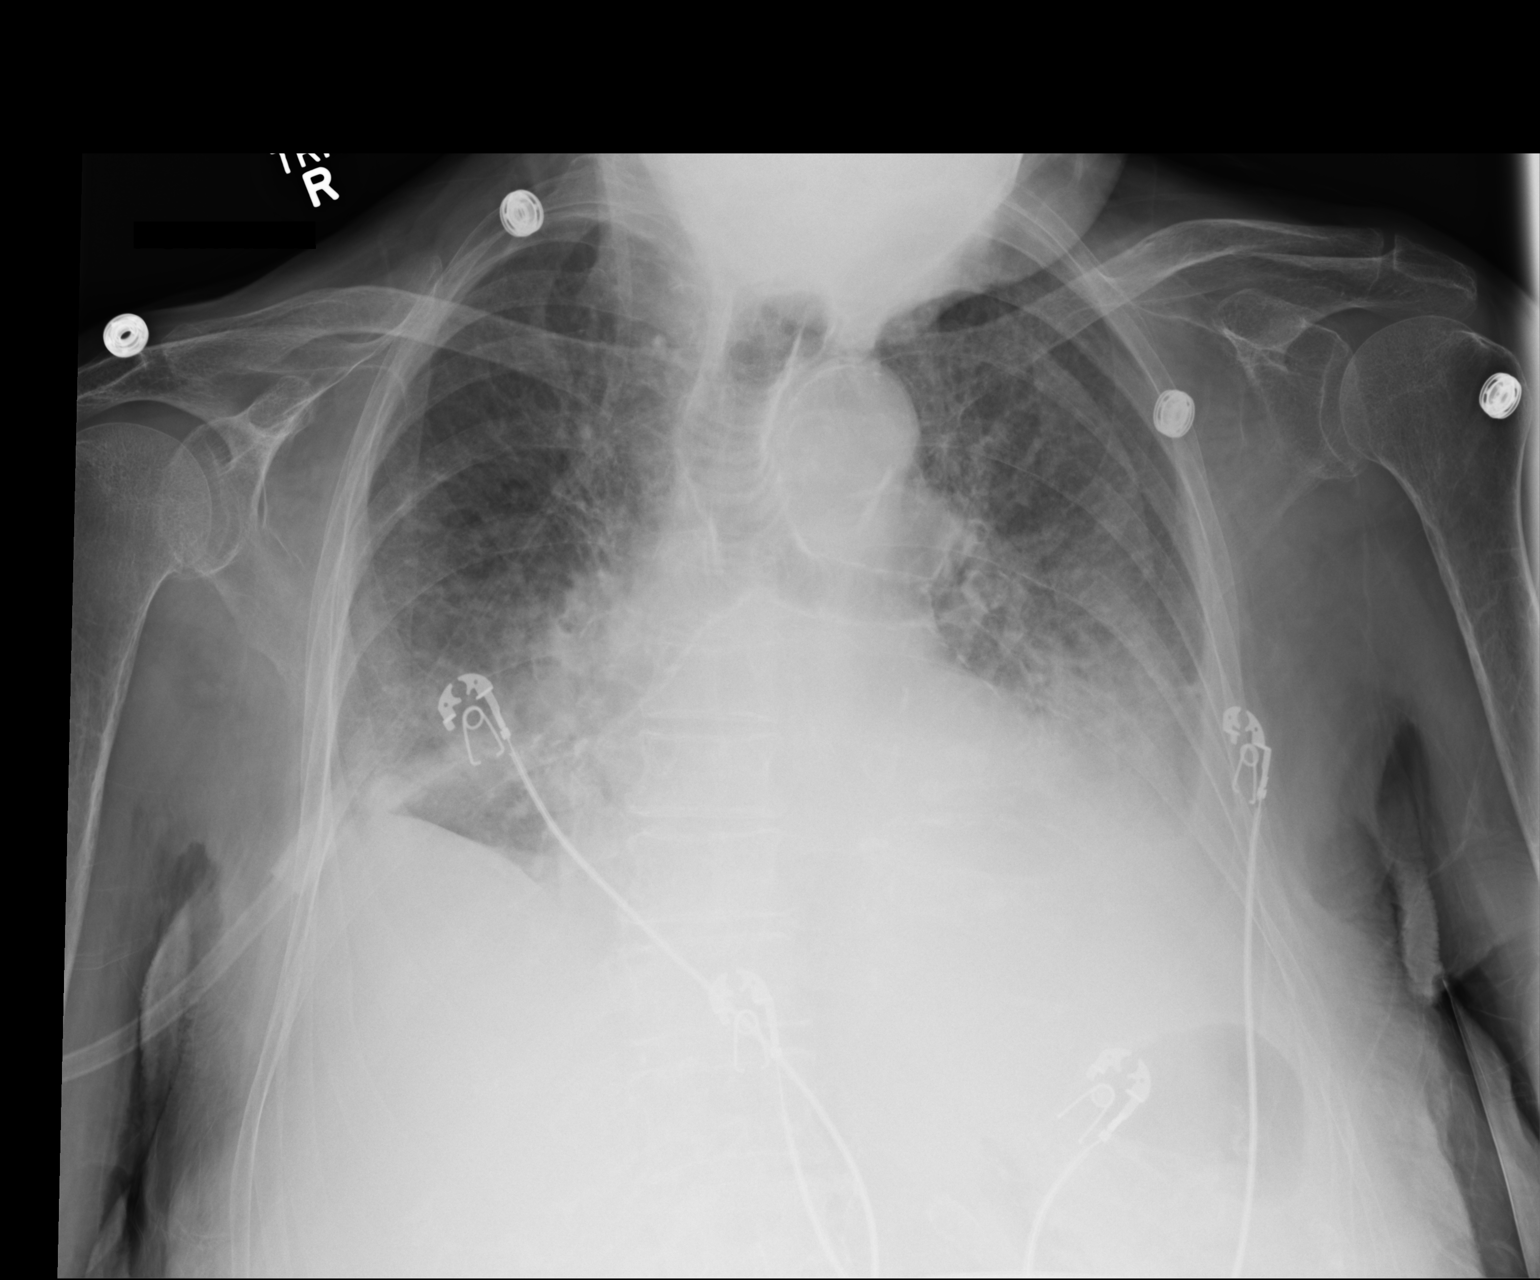

[1 of 1 positions shown; findings below may reference images not displayed]

FINDINGS: Cardiomegaly is noted. Central vascular congestion and mild
perihilar interstitial prominence highly suspicious for mild
pulmonary edema. There is small left pleural effusion with left
lower lobe atelectasis or infiltrate. Atherosclerotic calcifications
of thoracic aorta again noted.
IMPRESSION: Central mild vascular congestion and mild perihilar interstitial
prominence suspicious for mild pulmonary edema. Small left pleural
effusion with left lower lobe atelectasis or infiltrate.

## 2017-07-24 IMAGING — CT CT CHEST W/ CM
2 of 3 series · 14 of 36 positions shown, 17 images · IV contrast (omnipaque)
Comparison: None

CLINICAL DATA: Severe shortness of Breath and cough.

EXAM:
CT CHEST WITH CONTRAST
TECHNIQUE: Multidetector CT imaging of the chest was performed during
intravenous contrast administration.
CONTRAST:  75mL OMNIPAQUE IOHEXOL 300 MG/ML  SOLN

[Series 201: chest with, idose (2) · axial · 0.68mm/px · z∈[-203,+27]mm · 11 of 56 slices shown, 14 images]
[im 5/56  mediastinal]
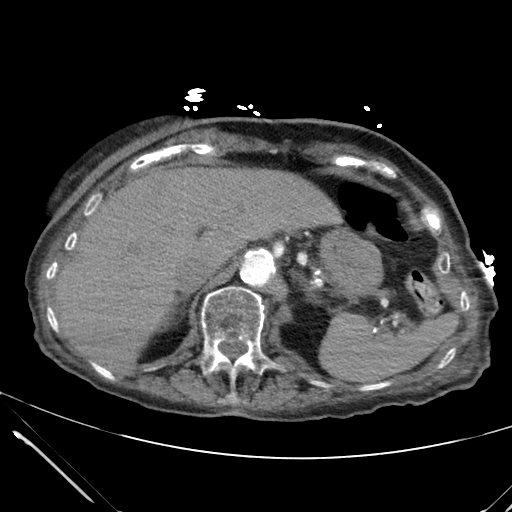
[im 5/56  lung]
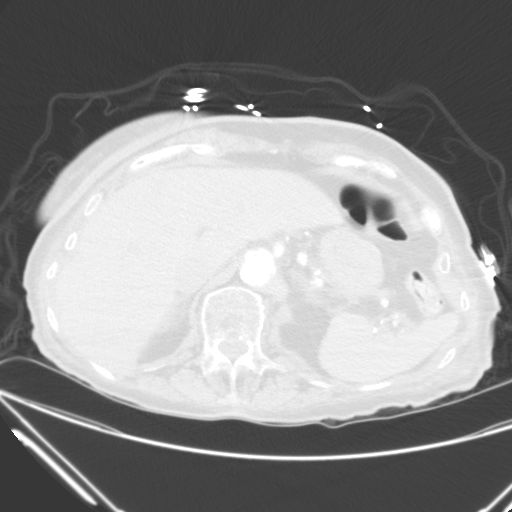
[im 9/56  lung]
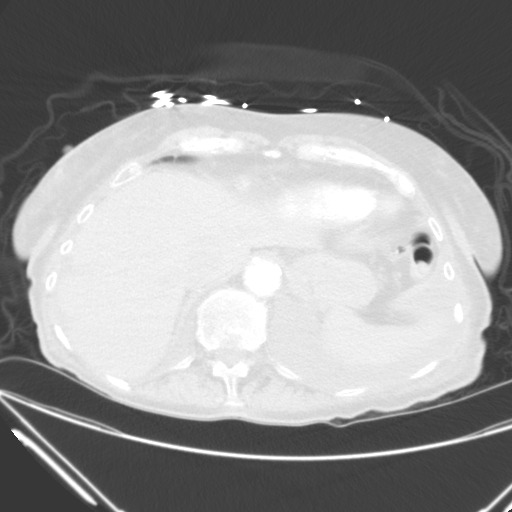
[im 13/56  lung]
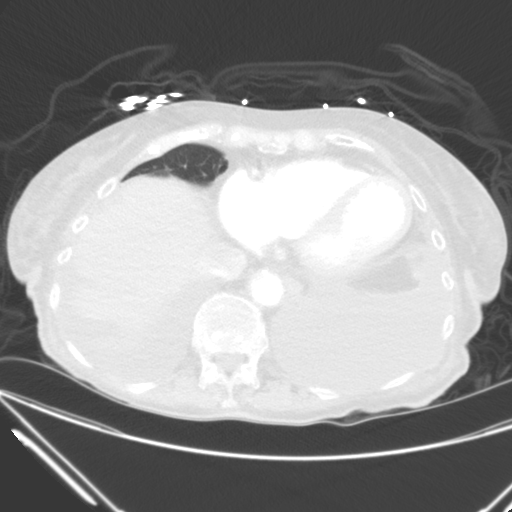
[im 19/56  lung]
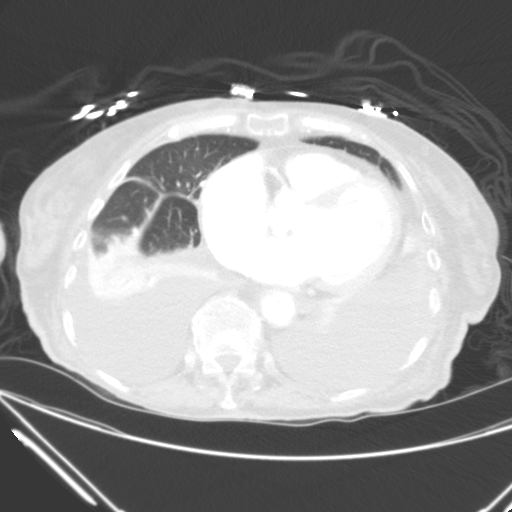
[im 23/56  mediastinal]
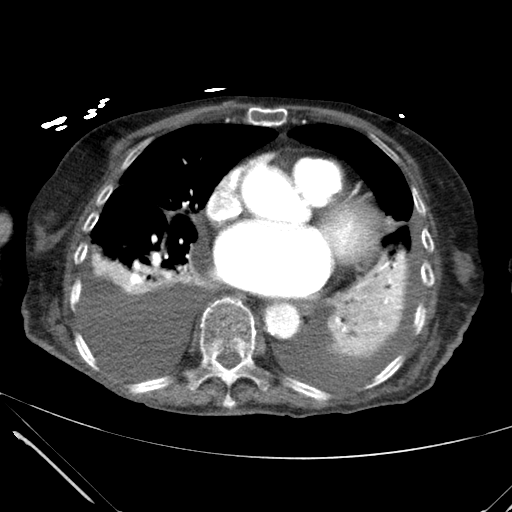
[im 23/56  lung]
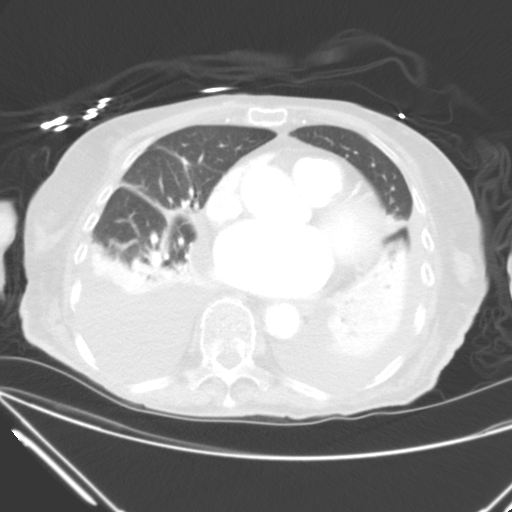
[im 29/56  lung]
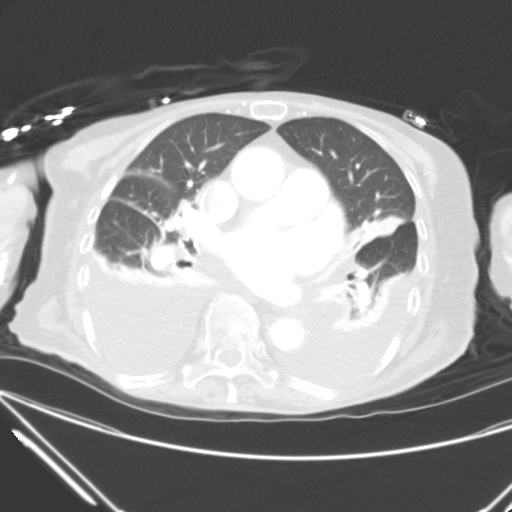
[im 33/56  lung]
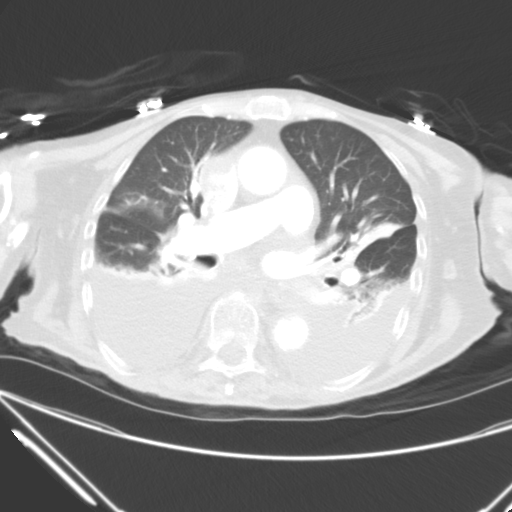
[im 37/56  lung]
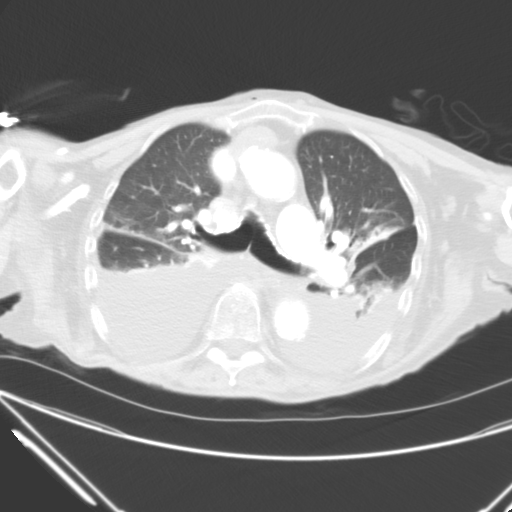
[im 43/56  mediastinal]
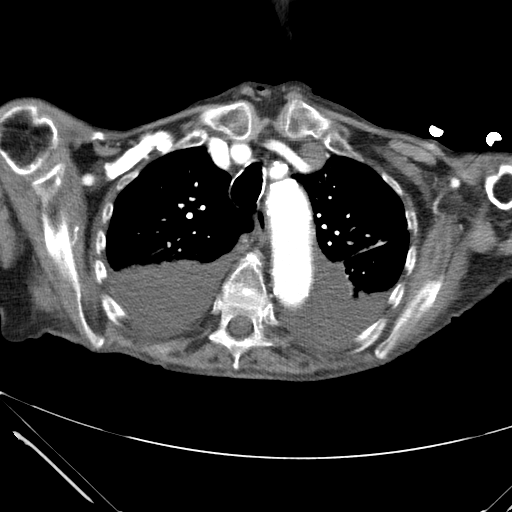
[im 43/56  lung]
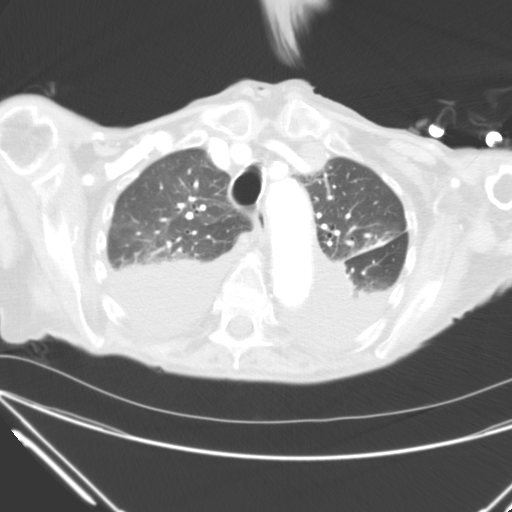
[im 47/56  lung]
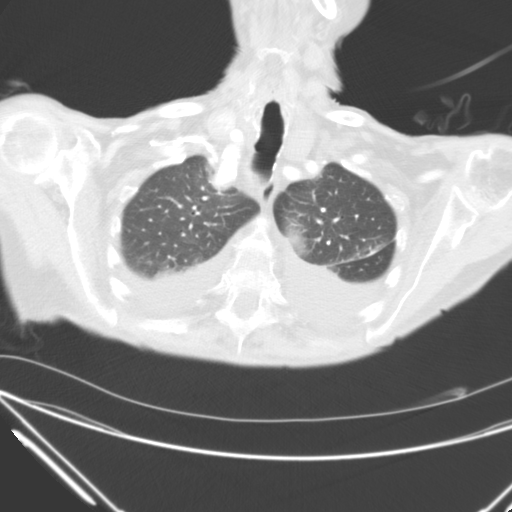
[im 51/56  lung]
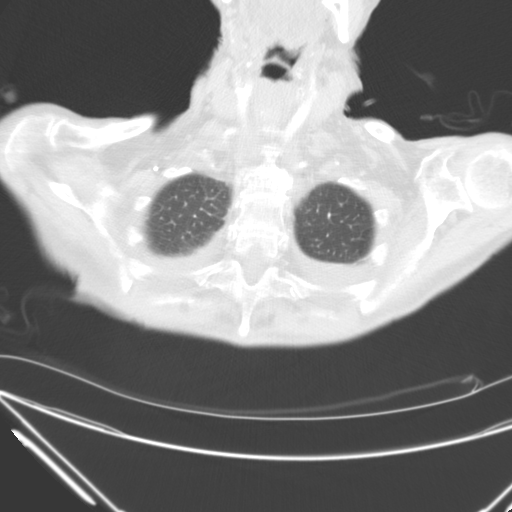

[Series 203: coronal, idose (2) · coronal · 0.45mm/px · 3 of 139 slices shown]
[im 28/139  lung]
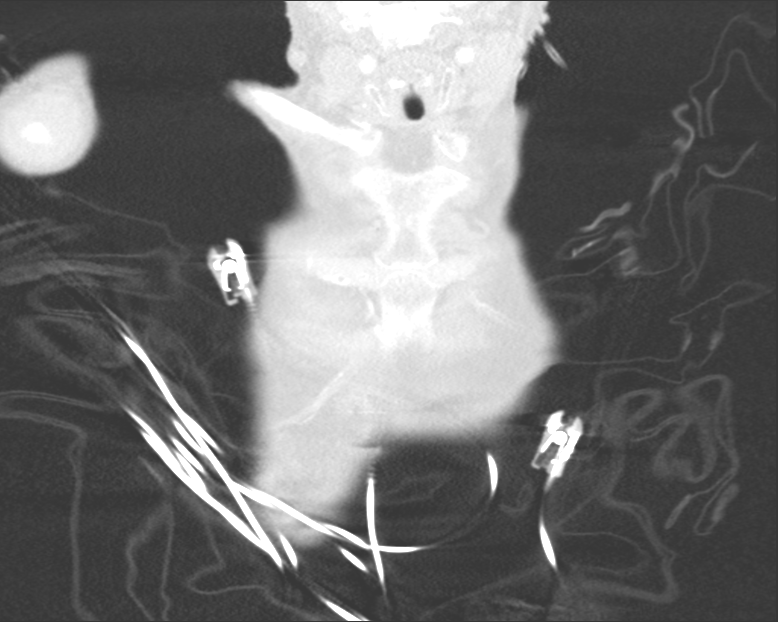
[im 56/139  lung]
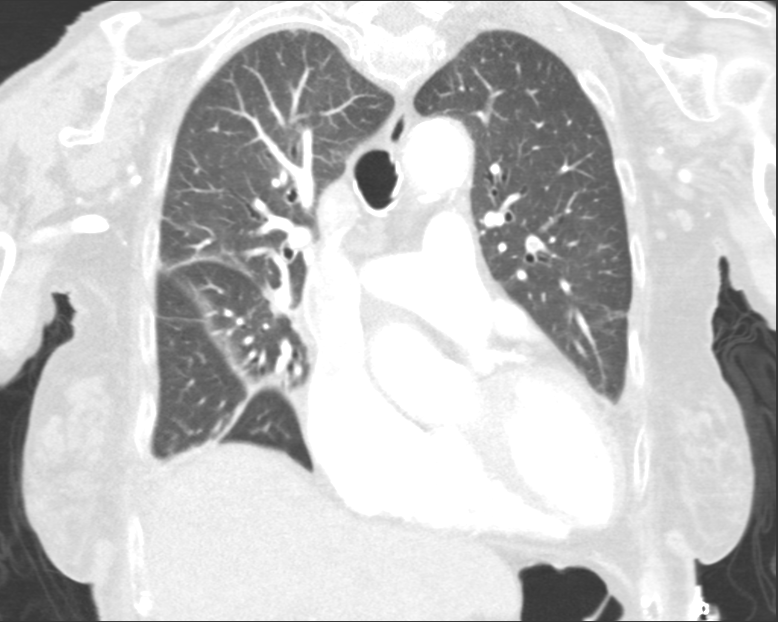
[im 83/139  lung]
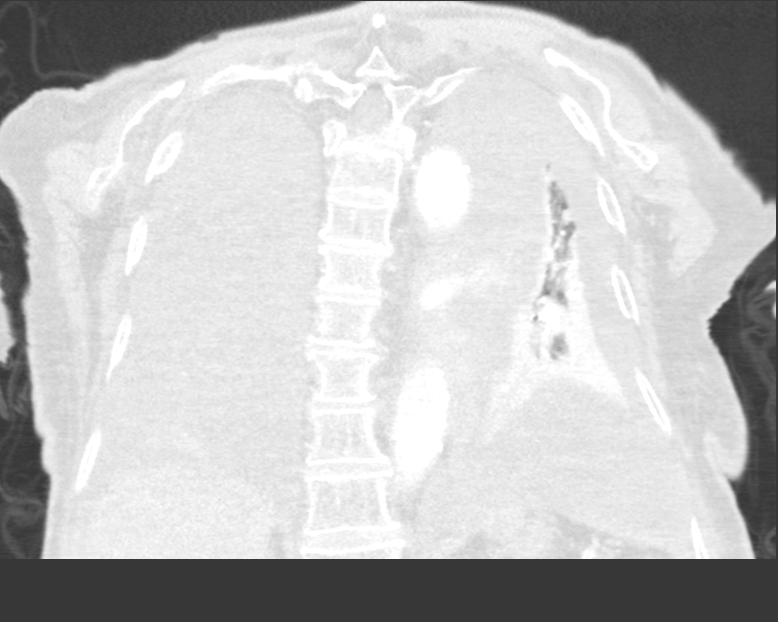

[14 of 36 positions shown; findings below may reference images not displayed]

FINDINGS: Mediastinum: The heart size is normal. Aortic atherosclerosis is
identified. No pericardial effusion. Calcification within the RCA
and LAD coronary arteries noted. The trachea appears patent and is
midline. Unremarkable appearance of the esophagus.

Lungs/Pleura: Moderate to large bilateral pleural effusions
identified. There is subsegmental atelectasis within the lingula.
Compressive type atelectasis and consolidation is noted within both
lower lobes.

Upper Abdomen: Low-attenuation structure within the anterior right
lobe of liver measures 8 mm and is too small to characterize.
Calcified granuloma is noted within the dome of liver. The
visualized portions of the adrenal glands are normal. The visualized
portions of the kidneys and spleen are also normal.

Musculoskeletal: There is moderate kyphosis involving the upper
thoracic spine. No acute compression fractures identified.
IMPRESSION: 1. Aortic atherosclerosis and multi vessel coronary artery
calcification.
2. Moderate to large bilateral pleural effusions with associated
compressive type atelectasis and consolidation of both lower lobes.
3. Thoracic kyphosis.

## 2017-07-25 IMAGING — US US THORACENTESIS ASP PLEURAL SPACE W/IMG GUIDE
1 series · 3 of 3 positions shown · non-contrast
Comparison: none

INDICATION: Acute hypoxic respiratory failure. Bilateral pleural effusions.
Request for diagnostic and therapeutic thoracentesis.

[Series 1: us thoracentesis asp pleural space w/img guide · 0.26mm/px · 3 of 3 slices shown]
[im 1/3]
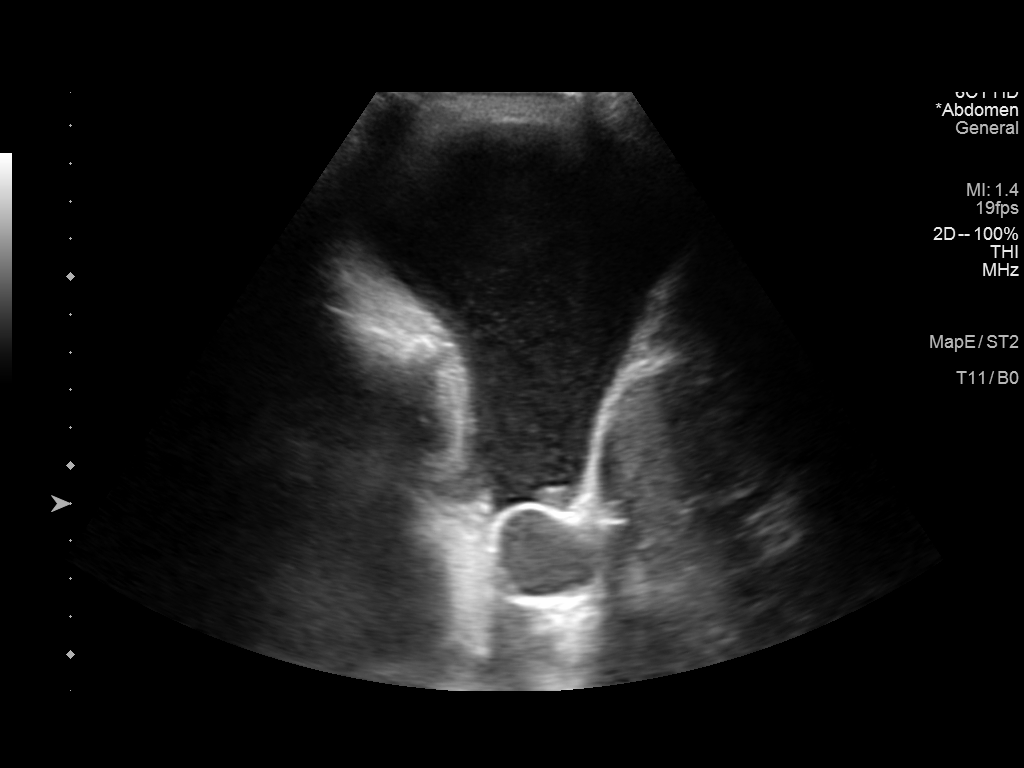
[im 2/3]
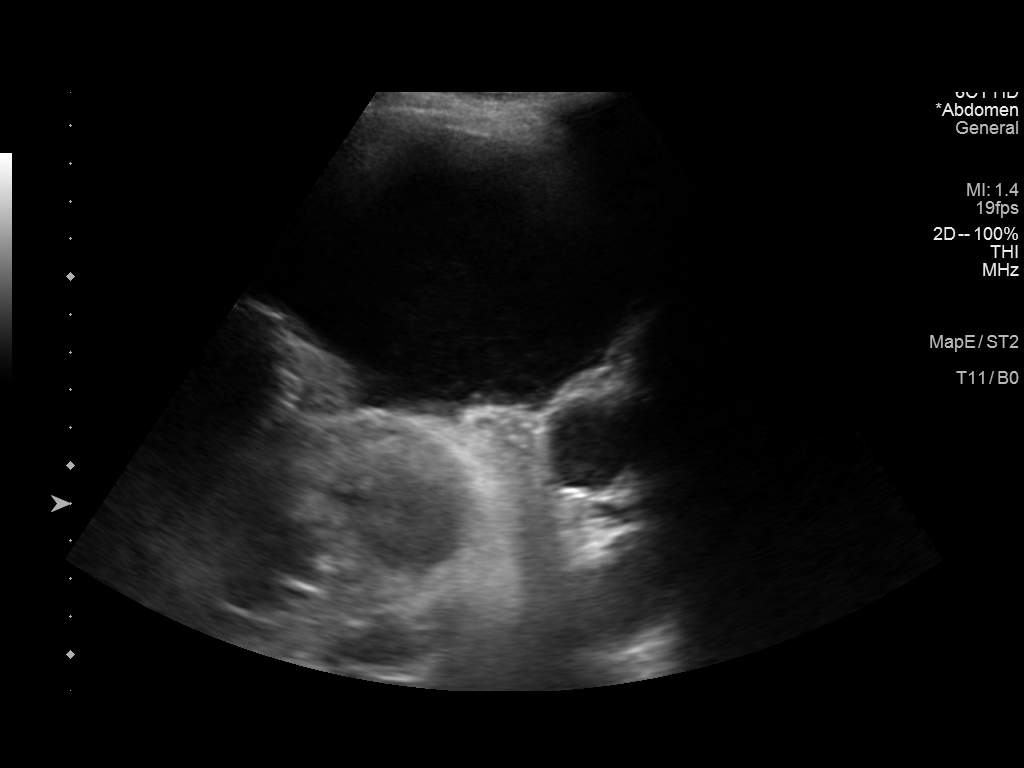
[im 3/3]
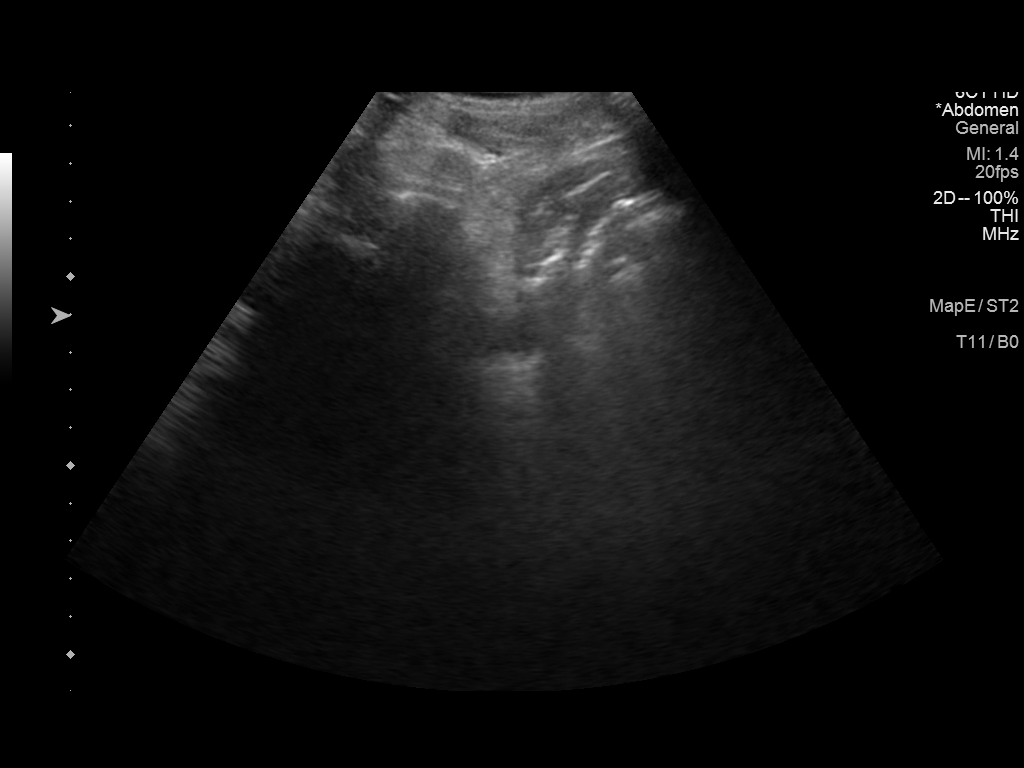

[3 of 3 positions shown; findings below may reference images not displayed]

EXAM:
ULTRASOUND GUIDED RIGHT THORACENTESIS

MEDICATIONS:
1% Lidocaine.

COMPLICATIONS:
None immediate.

PROCEDURE:
An ultrasound guided thoracentesis was thoroughly discussed with the
patient and questions answered. The benefits, risks, alternatives
and complications were also discussed. The patient understands and
wishes to proceed with the procedure. Written consent was obtained.

Ultrasound was performed to localize and mark an adequate pocket of
fluid in the right chest. The area was then prepped and draped in
the normal sterile fashion. 1% Lidocaine was used for local
anesthesia. Under ultrasound guidance a 6 Fr Safe-T-Centesis
catheter was introduced. Thoracentesis was performed. The catheter
was removed and a dressing applied.
FINDINGS: A total of approximately 750 mls of clear yellow fluid was removed.
Samples were sent to the laboratory as requested by the clinical
team.
IMPRESSION: Successful ultrasound guided right thoracentesis yielding 750 mls of
pleural fluid.
